# Patient Record
Sex: Female | Born: 1980 | Race: White | Hispanic: No | Marital: Married | Smoking: Never smoker
Health system: Southern US, Community
[De-identification: ages and names within clinical notes are randomized; demographics above are authoritative.]

## PROBLEM LIST (undated history)

## (undated) DIAGNOSIS — G510 Bell's palsy: Secondary | ICD-10-CM

## (undated) DIAGNOSIS — N200 Calculus of kidney: Secondary | ICD-10-CM

## (undated) DIAGNOSIS — Z789 Other specified health status: Secondary | ICD-10-CM

## (undated) HISTORY — PX: NO PAST SURGERIES: SHX2092

## (undated) HISTORY — PX: CATARACT EXTRACTION: SUR2

## (undated) HISTORY — PX: DILATION AND CURETTAGE OF UTERUS: SHX78

## (undated) HISTORY — PX: EYE SURGERY: SHX253

## (undated) HISTORY — PX: ANTERIOR CRUCIATE LIGAMENT REPAIR: SHX115

---
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
## 1999-08-02 ENCOUNTER — Other Ambulatory Visit: Admission: RE | Admit: 1999-08-02 | Discharge: 1999-08-02 | Payer: Self-pay | Admitting: Nurse Practitioner

## 2001-09-08 ENCOUNTER — Other Ambulatory Visit: Admission: RE | Admit: 2001-09-08 | Discharge: 2001-09-08 | Payer: Self-pay | Admitting: Gynecology

## 2002-10-20 ENCOUNTER — Other Ambulatory Visit: Admission: RE | Admit: 2002-10-20 | Discharge: 2002-10-20 | Payer: Self-pay | Admitting: Gynecology

## 2004-01-05 ENCOUNTER — Other Ambulatory Visit: Admission: RE | Admit: 2004-01-05 | Discharge: 2004-01-05 | Payer: Self-pay | Admitting: Gynecology

## 2004-04-25 ENCOUNTER — Encounter: Admission: RE | Admit: 2004-04-25 | Discharge: 2004-07-24 | Payer: Self-pay | Admitting: *Deleted

## 2004-07-25 ENCOUNTER — Encounter: Admission: RE | Admit: 2004-07-25 | Discharge: 2004-09-02 | Payer: Self-pay | Admitting: *Deleted

## 2005-03-03 ENCOUNTER — Ambulatory Visit: Payer: Self-pay | Admitting: Internal Medicine

## 2005-07-10 ENCOUNTER — Other Ambulatory Visit: Admission: RE | Admit: 2005-07-10 | Discharge: 2005-07-10 | Payer: Self-pay | Admitting: Gynecology

## 2006-05-05 ENCOUNTER — Ambulatory Visit: Payer: Self-pay | Admitting: Internal Medicine

## 2007-06-07 ENCOUNTER — Other Ambulatory Visit: Admission: RE | Admit: 2007-06-07 | Discharge: 2007-06-07 | Payer: Self-pay | Admitting: Gynecology

## 2012-12-22 NOTE — L&D Delivery Note (Signed)
Delivery Note At 4:22 PM a viable and healthy female was delivered via Vaginal, Spontaneous Delivery (Presentation: Right Occiput Anterior).  APGAR: 9, 9; weight pending.   Placenta status: Intact, Spontaneous.  Cord: 3 vessels with the following complications: None.  Cord pH: na Tight [place] x one reduced on perineum.  Anesthesia: Epidural  Episiotomy: none Lacerations: second Suture Repair: 2.0 vicryl rapide Est. Blood Loss (mL): 200  Mom to postpartum.  Baby to nursery-stable.  Denetra Formoso J 10/06/2013, 4:40 PM

## 2013-02-28 LAB — OB RESULTS CONSOLE HIV ANTIBODY (ROUTINE TESTING)
HIV: NONREACTIVE
HIV: NONREACTIVE

## 2013-02-28 LAB — OB RESULTS CONSOLE RPR
RPR: NONREACTIVE
RPR: NONREACTIVE

## 2013-02-28 LAB — OB RESULTS CONSOLE ABO/RH: RH Type: NEGATIVE

## 2013-02-28 LAB — OB RESULTS CONSOLE RUBELLA ANTIBODY, IGM: Rubella: IMMUNE

## 2013-02-28 LAB — OB RESULTS CONSOLE HEPATITIS B SURFACE ANTIGEN: Hepatitis B Surface Ag: NEGATIVE

## 2013-02-28 LAB — OB RESULTS CONSOLE ANTIBODY SCREEN: Antibody Screen: NEGATIVE

## 2013-03-02 LAB — OB RESULTS CONSOLE GC/CHLAMYDIA
Chlamydia: NEGATIVE
Gonorrhea: NEGATIVE
Gonorrhea: NEGATIVE

## 2013-09-01 LAB — OB RESULTS CONSOLE GBS
GBS: NEGATIVE
GBS: NEGATIVE

## 2013-09-02 LAB — OB RESULTS CONSOLE GBS: GBS: NEGATIVE

## 2013-10-05 ENCOUNTER — Other Ambulatory Visit: Payer: Self-pay | Admitting: Obstetrics and Gynecology

## 2013-10-06 ENCOUNTER — Encounter (HOSPITAL_COMMUNITY): Payer: 59 | Admitting: Anesthesiology

## 2013-10-06 ENCOUNTER — Inpatient Hospital Stay (HOSPITAL_COMMUNITY): Payer: 59 | Admitting: Anesthesiology

## 2013-10-06 ENCOUNTER — Encounter (HOSPITAL_COMMUNITY): Payer: Self-pay | Admitting: *Deleted

## 2013-10-06 ENCOUNTER — Inpatient Hospital Stay (HOSPITAL_COMMUNITY)
Admission: AD | Admit: 2013-10-06 | Discharge: 2013-10-07 | DRG: 775 | Disposition: A | Discharge status: Home / Self Care | Payer: 59 | Source: Ambulatory Visit | Attending: Obstetrics and Gynecology | Admitting: Obstetrics and Gynecology

## 2013-10-06 DIAGNOSIS — O480 Post-term pregnancy: Principal | ICD-10-CM | POA: Diagnosis present

## 2013-10-06 HISTORY — DX: Other specified health status: Z78.9

## 2013-10-06 HISTORY — DX: Bell's palsy: G51.0

## 2013-10-06 LAB — CBC
Hemoglobin: 12.7 g/dL (ref 12.0–15.0)
MCH: 30 pg (ref 26.0–34.0)
MCHC: 34.7 g/dL (ref 30.0–36.0)
MCV: 86.5 fL (ref 78.0–100.0)
Platelets: 169 10*3/uL (ref 150–400)
RDW: 14.5 % (ref 11.5–15.5)
WBC: 8.4 10*3/uL (ref 4.0–10.5)

## 2013-10-06 LAB — ABO/RH: ABO/RH(D): A NEG

## 2013-10-06 MED ORDER — FLEET ENEMA 7-19 GM/118ML RE ENEM: 1.0000 | ENEMA | RECTAL | Status: DC | PRN

## 2013-10-06 MED ORDER — DIPHENHYDRAMINE HCL 25 MG PO CAPS: 25.0000 mg | ORAL_CAPSULE | Freq: Four times a day (QID) | ORAL | Status: DC | PRN

## 2013-10-06 MED ORDER — TERBUTALINE SULFATE 1 MG/ML IJ SOLN: 0.2500 mg | Freq: Once | INTRAMUSCULAR | Status: DC | PRN

## 2013-10-06 MED ORDER — LACTATED RINGERS IV SOLN: 500.0000 mL | INTRAVENOUS | Status: DC | PRN

## 2013-10-06 MED ORDER — LIDOCAINE HCL (PF) 1 % IJ SOLN
30.0000 mL | INTRAMUSCULAR | Status: DC | PRN
  Filled 2013-10-06 (×2): qty 30

## 2013-10-06 MED ORDER — FENTANYL 2.5 MCG/ML BUPIVACAINE 1/10 % EPIDURAL INFUSION (WH - ANES)
14.0000 mL/h | INTRAMUSCULAR | Status: DC | PRN
  Administered 2013-10-06: 14 mL/h via EPIDURAL
  Filled 2013-10-06: qty 125

## 2013-10-06 MED ORDER — OXYTOCIN 40 UNITS IN LACTATED RINGERS INFUSION - SIMPLE MED
1.0000 m[IU]/min | INTRAVENOUS | Status: DC
  Administered 2013-10-06: 6 m[IU]/min via INTRAVENOUS
  Administered 2013-10-06: 4 m[IU]/min via INTRAVENOUS
  Administered 2013-10-06: 8 m[IU]/min via INTRAVENOUS
  Filled 2013-10-06: qty 1000

## 2013-10-06 MED ORDER — IBUPROFEN 600 MG PO TABS
600.0000 mg | ORAL_TABLET | Freq: Four times a day (QID) | ORAL | Status: DC
  Administered 2013-10-06 – 2013-10-07 (×5): 600 mg via ORAL
  Filled 2013-10-06 (×4): qty 1

## 2013-10-06 MED ORDER — EPHEDRINE 5 MG/ML INJ
10.0000 mg | INTRAVENOUS | Status: DC | PRN
  Filled 2013-10-06: qty 2

## 2013-10-06 MED ORDER — PHENYLEPHRINE 40 MCG/ML (10ML) SYRINGE FOR IV PUSH (FOR BLOOD PRESSURE SUPPORT)
80.0000 ug | PREFILLED_SYRINGE | INTRAVENOUS | Status: DC | PRN
  Filled 2013-10-06: qty 2
  Filled 2013-10-06: qty 5

## 2013-10-06 MED ORDER — ACETAMINOPHEN 325 MG PO TABS: 650.0000 mg | ORAL_TABLET | ORAL | Status: DC | PRN

## 2013-10-06 MED ORDER — SODIUM BICARBONATE 8.4 % IV SOLN
INTRAVENOUS | Status: DC | PRN
  Administered 2013-10-06: 5 mL via EPIDURAL

## 2013-10-06 MED ORDER — TETANUS-DIPHTH-ACELL PERTUSSIS 5-2.5-18.5 LF-MCG/0.5 IM SUSP: 0.5000 mL | Freq: Once | INTRAMUSCULAR | Status: DC

## 2013-10-06 MED ORDER — OXYTOCIN BOLUS FROM INFUSION: 500.0000 mL | INTRAVENOUS | Status: DC

## 2013-10-06 MED ORDER — LACTATED RINGERS IV SOLN
INTRAVENOUS | Status: DC
  Administered 2013-10-06: 950 mL via INTRAVENOUS

## 2013-10-06 MED ORDER — LANOLIN HYDROUS EX OINT: TOPICAL_OINTMENT | CUTANEOUS | Status: DC | PRN

## 2013-10-06 MED ORDER — DIPHENHYDRAMINE HCL 50 MG/ML IJ SOLN: 12.5000 mg | INTRAMUSCULAR | Status: DC | PRN

## 2013-10-06 MED ORDER — EPHEDRINE 5 MG/ML INJ
10.0000 mg | INTRAVENOUS | Status: DC | PRN
  Filled 2013-10-06: qty 2
  Filled 2013-10-06: qty 4

## 2013-10-06 MED ORDER — METHYLERGONOVINE MALEATE 0.2 MG/ML IJ SOLN: 0.2000 mg | INTRAMUSCULAR | Status: DC | PRN

## 2013-10-06 MED ORDER — ONDANSETRON HCL 4 MG/2ML IJ SOLN: 4.0000 mg | Freq: Four times a day (QID) | INTRAMUSCULAR | Status: DC | PRN

## 2013-10-06 MED ORDER — IBUPROFEN 600 MG PO TABS
600.0000 mg | ORAL_TABLET | Freq: Four times a day (QID) | ORAL | Status: DC | PRN
  Filled 2013-10-06: qty 1

## 2013-10-06 MED ORDER — ZOLPIDEM TARTRATE 5 MG PO TABS: 5.0000 mg | ORAL_TABLET | Freq: Every evening | ORAL | Status: DC | PRN

## 2013-10-06 MED ORDER — SENNOSIDES-DOCUSATE SODIUM 8.6-50 MG PO TABS
2.0000 | ORAL_TABLET | ORAL | Status: DC
  Administered 2013-10-07: 2 via ORAL
  Filled 2013-10-06: qty 2

## 2013-10-06 MED ORDER — PHENYLEPHRINE 40 MCG/ML (10ML) SYRINGE FOR IV PUSH (FOR BLOOD PRESSURE SUPPORT)
80.0000 ug | PREFILLED_SYRINGE | INTRAVENOUS | Status: DC | PRN
  Filled 2013-10-06: qty 2

## 2013-10-06 MED ORDER — OXYTOCIN 40 UNITS IN LACTATED RINGERS INFUSION - SIMPLE MED
62.5000 mL/h | INTRAVENOUS | Status: DC
  Administered 2013-10-06: 2 mL/h via INTRAVENOUS

## 2013-10-06 MED ORDER — BENZOCAINE-MENTHOL 20-0.5 % EX AERO
1.0000 "application " | INHALATION_SPRAY | CUTANEOUS | Status: DC | PRN
  Administered 2013-10-06: 1 via TOPICAL
  Filled 2013-10-06: qty 56

## 2013-10-06 MED ORDER — LIDOCAINE HCL (PF) 1 % IJ SOLN: 30.0000 mL | INTRAMUSCULAR | Status: DC | PRN

## 2013-10-06 MED ORDER — METHYLERGONOVINE MALEATE 0.2 MG PO TABS: 0.2000 mg | ORAL_TABLET | ORAL | Status: DC | PRN

## 2013-10-06 MED ORDER — ONDANSETRON HCL 4 MG PO TABS: 4.0000 mg | ORAL_TABLET | ORAL | Status: DC | PRN

## 2013-10-06 MED ORDER — IBUPROFEN 600 MG PO TABS: 600.0000 mg | ORAL_TABLET | Freq: Four times a day (QID) | ORAL | Status: DC | PRN

## 2013-10-06 MED ORDER — SIMETHICONE 80 MG PO CHEW: 80.0000 mg | CHEWABLE_TABLET | ORAL | Status: DC | PRN

## 2013-10-06 MED ORDER — OXYCODONE-ACETAMINOPHEN 5-325 MG PO TABS: 1.0000 | ORAL_TABLET | ORAL | Status: DC | PRN

## 2013-10-06 MED ORDER — CITRIC ACID-SODIUM CITRATE 334-500 MG/5ML PO SOLN: 30.0000 mL | ORAL | Status: DC | PRN

## 2013-10-06 MED ORDER — ONDANSETRON HCL 4 MG/2ML IJ SOLN: 4.0000 mg | INTRAMUSCULAR | Status: DC | PRN

## 2013-10-06 MED ORDER — OXYTOCIN 40 UNITS IN LACTATED RINGERS INFUSION - SIMPLE MED
62.5000 mL/h | INTRAVENOUS | Status: DC
  Administered 2013-10-06: 62.5 mL/h via INTRAVENOUS

## 2013-10-06 MED ORDER — DIBUCAINE 1 % RE OINT: 1.0000 "application " | TOPICAL_OINTMENT | RECTAL | Status: DC | PRN

## 2013-10-06 MED ORDER — LACTATED RINGERS IV SOLN
500.0000 mL | Freq: Once | INTRAVENOUS | Status: AC
  Administered 2013-10-06: 500 mL via INTRAVENOUS

## 2013-10-06 MED ORDER — BUTORPHANOL TARTRATE 1 MG/ML IJ SOLN: 1.0000 mg | INTRAMUSCULAR | Status: DC | PRN

## 2013-10-06 MED ORDER — PRENATAL MULTIVITAMIN CH
1.0000 | ORAL_TABLET | Freq: Every day | ORAL | Status: DC
  Administered 2013-10-07: 1 via ORAL
  Filled 2013-10-06: qty 1

## 2013-10-06 MED ORDER — WITCH HAZEL-GLYCERIN EX PADS: 1.0000 "application " | MEDICATED_PAD | CUTANEOUS | Status: DC | PRN

## 2013-10-06 MED ORDER — LACTATED RINGERS IV SOLN: INTRAVENOUS | Status: DC

## 2013-10-06 NOTE — H&P (Signed)
Leslie Schultz is a 32 y.o. female presenting for postdates induction. Maternal Medical History:  Fetal activity: Perceived fetal activity is normal.    Prenatal complications: no prenatal complications Prenatal Complications - Diabetes: none.    OB History   No data available     No past medical history on file. No past surgical history on file. Family History: family history is not on file. Social History:  has no tobacco, alcohol, and drug history on file.   Prenatal Transfer Tool  Maternal Diabetes: No Genetic Screening: Normal Maternal Ultrasounds/Referrals: Normal Fetal Ultrasounds or other Referrals:  None Maternal Substance Abuse:  No Significant Maternal Medications:  None Significant Maternal Lab Results:  None Other Comments:  None  Review of Systems  Constitutional: Negative.   HENT: Negative.   Respiratory: Negative.   Cardiovascular: Negative.   Gastrointestinal: Negative.   Genitourinary: Negative.   Musculoskeletal: Negative.   Skin: Negative.   Neurological: Negative.   Endo/Heme/Allergies: Negative.   Psychiatric/Behavioral: Negative.       There were no vitals taken for this visit. Maternal Exam:  Uterine Assessment: Contraction strength is mild.  Abdomen: Patient reports no abdominal tenderness. Fetal presentation: vertex  Introitus: Normal vulva. Normal vagina.  Ferning test: not done.  Nitrazine test: not done.  Pelvis: adequate for delivery.   Cervix: Cervix evaluated by digital exam.     Physical Exam  Constitutional: She is oriented to person, place, and time. She appears well-developed and well-nourished.  Eyes: Pupils are equal, round, and reactive to light.  Neck: Normal range of motion.  Cardiovascular: Normal rate and regular rhythm.   Respiratory: Effort normal.  GI: Soft.  Genitourinary: Vagina normal and uterus normal.  Musculoskeletal: Normal range of motion.  Neurological: She is alert and oriented to person, place, and  time.  Skin: Skin is warm.    Prenatal labs: ABO, Rh: A/Negative/-- (03/10 0000) Antibody: Negative (03/10 0000) Rubella: Immune (03/10 0000) RPR: Nonreactive, Nonreactive (03/10 0000)  HBsAg: Negative (03/10 0000)  HIV: Non-reactive, Non-reactive (03/10 0000)  GBS: Negative (09/12 0000)   Assessment/Plan: Postdates induction Pitocin   Leslie Schultz J 10/06/2013, 7:01 AM

## 2013-10-06 NOTE — Anesthesia Preprocedure Evaluation (Signed)

## 2013-10-06 NOTE — Progress Notes (Signed)
Leslie Schultz is a 32 y.o. G2P1 at [redacted]w[redacted]d by LMP admitted for induction of labor due to Post dates. Due date 10/10.  Subjective: Requesting epidural  Objective: BP 120/72  Pulse 100  Temp(Src) 98.2 F (36.8 C) (Oral)  Resp 20  Ht 5\' 7"  (1.702 m)  Wt 94.802 kg (209 lb)  BMI 32.73 kg/m2  SpO2 100%  LMP 12/25/2012      FHT:  FHR: 125 bpm, variability: moderate,  accelerations:  Present,  decelerations:  Absent UC:   regular, every 3 minutes SVE:   Dilation: 3.5 Effacement (%): 70 Station: -1 Exam by:: Ace Gins, RN  Labs: Lab Results  Component Value Date   WBC 8.4 10/06/2013   HGB 12.7 10/06/2013   HCT 36.6 10/06/2013   MCV 86.5 10/06/2013   PLT 169 10/06/2013    Assessment / Plan: Induction of labor due to postterm,  progressing well on pitocin  Labor: Progressing normally Preeclampsia:  no signs or symptoms of toxicity and labs stable Fetal Wellbeing:  Category I Pain Control:  Epidural I/D:  n/a Anticipated MOD:  NSVD  Leslie Schultz J 10/06/2013, 3:09 PM

## 2013-10-06 NOTE — Anesthesia Procedure Notes (Signed)

## 2013-10-06 NOTE — Progress Notes (Signed)
Leslie Schultz is a 32 y.o. No obstetric history on file. at Unknown by LMP admitted for induction of labor due to Post dates. Due date 10/10.  Subjective: comfortable  Objective: There were no vitals taken for this visit.      FHT:  FHR: 145 bpm, variability: moderate,  accelerations:  Present,  decelerations:  Absent UC:   irregular, every 10 minutes SVE:    2/60/-1 AROM- clear  Labs: No results found for this basename: WBC, HGB, HCT, MCV, PLT    Assessment / Plan: Induction of labor due to postterm,  progressing well on pitocin  Labor: Progressing on Pitocin, will continue to increase then AROM Preeclampsia:  labs stable Fetal Wellbeing:  Category I Pain Control:  Labor support without medications I/D:  n/a Anticipated MOD:  NSVD  Leslie Schultz 10/06/2013, 6:59 AM

## 2013-10-07 LAB — CBC
HCT: 38.1 % (ref 36.0–46.0)
MCH: 29.7 pg (ref 26.0–34.0)
MCV: 87.8 fL (ref 78.0–100.0)
Platelets: 142 10*3/uL — ABNORMAL LOW (ref 150–400)
RBC: 4.34 MIL/uL (ref 3.87–5.11)
WBC: 10.4 10*3/uL (ref 4.0–10.5)

## 2013-10-07 MED ORDER — IBUPROFEN 600 MG PO TABS: 600.0000 mg | ORAL_TABLET | Freq: Four times a day (QID) | ORAL | Status: DC

## 2013-10-07 MED ORDER — RHO D IMMUNE GLOBULIN 1500 UNIT/2ML IJ SOLN
300.0000 ug | Freq: Once | INTRAMUSCULAR | Status: AC
  Administered 2013-10-07: 300 ug via INTRAMUSCULAR
  Filled 2013-10-07: qty 2

## 2013-10-07 NOTE — Anesthesia Postprocedure Evaluation (Signed)
Anesthesia Post Note  Patient: Leslie Schultz  Procedure(s) Performed: * No procedures listed *  Anesthesia type: Epidural  Patient location: Mother/Baby  Post pain: Pain level controlled  Post assessment: Post-op Vital signs reviewed  Last Vitals:  Filed Vitals:   10/07/13 0640  BP: 120/61  Pulse: 76  Temp: 36.6 C  Resp: 18    Post vital signs: Reviewed  Level of consciousness:alert  Complications: No apparent anesthesia complications

## 2013-10-07 NOTE — Progress Notes (Signed)
Patient ID: Leslie Schultz, female   DOB: 03/19/1981, 32 y.o.   MRN: [id number] PPD # 1 SVD  S:  Reports feeling well and ready for an early discharge             Tolerating po/ No nausea or vomiting             Bleeding is light             Pain controlled with ibuprofen (OTC)             Up ad lib / ambulatory / voiding without difficulties    Newborn  Information for the patient's newborn:  Gwendlyn, [place] [[id number]]  female  breast feeding    O:  A & O x 3, in no apparent distress, very pleasant             VS:  Filed Vitals:   10/06/13 1731 10/06/13 1746 10/06/13 1845 10/07/13 0640  BP: 118/70 107/76 118/79 120/61  Pulse: 74 81 76 76  Temp:  98.1 F (36.7 C) 97.5 F (36.4 C) 97.9 F (36.6 C)  TempSrc:  Oral Axillary Oral  Resp: 18 18 20 18   Height:      Weight:      SpO2:        LABS:  Recent Labs  10/06/13 0700 10/07/13 0655  WBC 8.4 10.4  HGB 12.7 12.9  HCT 36.6 38.1  PLT 169 142*    Blood type: --/--/A NEG (10/17 7829)  Rubella: Immune (03/10 0000)     Lungs: Clear and unlabored  Heart: regular rate and rhythm / no murmurs  Abdomen: soft, non-tender, non-distended, normal bowel sounds             Fundus: firm, non-tender, U-2  Perineum: 2nd degree repair healing well, no edema  Lochia: light  Extremities: no edema, no calf pain or tenderness, no Homans    A/P: PPD # 1  32 y.o., [id number]   Active Problems:   Postpartum care following vaginal delivery (10/16)   Doing well - stable status  Routine post partum orders  Anticipate discharge tomorrow   Raelyn Mora, M, MSN, CNM 10/07/2013, 11:40 AM

## 2013-10-07 NOTE — Discharge Summary (Signed)
Obstetric Discharge Summary Reason for Admission: induction of labor Prenatal Procedures: ultrasound Intrapartum Procedures: spontaneous vaginal delivery Postpartum Procedures: none Complications-Operative and Postpartum: 2nd degree perineal laceration Hemoglobin  Date Value Range Status  10/07/2013 12.9  12.0 - 15.0 g/dL Final     HCT  Date Value Range Status  10/07/2013 38.1  36.0 - 46.0 % Final    Physical Exam:  General: alert, cooperative and no distress Lochia: appropriate Uterine Fundus: firm, midline, U-2 DVT Evaluation: No evidence of DVT seen on physical exam. Negative Homan's sign. No cords or calf tenderness. No significant calf/ankle edema.  Discharge Diagnoses: Term Pregnancy-delivered  Discharge Information: Date: 10/07/2013 Activity: pelvic rest Diet: routine Medications: PNV and Ibuprofen Condition: stable Instructions: refer to practice specific booklet Discharge to: home Follow-up Information   Follow up with Lenoard Aden, MD. Schedule an appointment as soon as possible for a visit in 6 weeks.   Specialty:  Obstetrics and Gynecology   Contact information:   [address] [place] [address] [phone]       Newborn Data: Live born female on 10/06/2013 Birth Weight: 8 lb 10.3 oz (3920 g) APGAR: 9, 9  Home with mother.  Kenard Gower, MSN, CNM 10/07/2013, 11:49 AM

## 2013-10-08 LAB — RH IG WORKUP (INCLUDES ABO/RH)
ABO/RH(D): A NEG
Antibody Screen: NEGATIVE
Fetal Screen: NEGATIVE

## 2014-10-23 ENCOUNTER — Encounter (HOSPITAL_COMMUNITY): Payer: Self-pay | Admitting: *Deleted

## 2015-06-08 ENCOUNTER — Emergency Department (HOSPITAL_BASED_OUTPATIENT_CLINIC_OR_DEPARTMENT_OTHER): Payer: 59

## 2015-06-08 ENCOUNTER — Emergency Department (HOSPITAL_BASED_OUTPATIENT_CLINIC_OR_DEPARTMENT_OTHER)
Admission: EM | Admit: 2015-06-08 | Discharge: 2015-06-08 | Disposition: A | Discharge status: Home / Self Care | Payer: 59 | Attending: Emergency Medicine | Admitting: Emergency Medicine

## 2015-06-08 ENCOUNTER — Encounter (HOSPITAL_BASED_OUTPATIENT_CLINIC_OR_DEPARTMENT_OTHER): Payer: Self-pay | Admitting: *Deleted

## 2015-06-08 DIAGNOSIS — Z3202 Encounter for pregnancy test, result negative: Secondary | ICD-10-CM | POA: Insufficient documentation

## 2015-06-08 DIAGNOSIS — R109 Unspecified abdominal pain: Secondary | ICD-10-CM

## 2015-06-08 DIAGNOSIS — N201 Calculus of ureter: Secondary | ICD-10-CM | POA: Diagnosis not present

## 2015-06-08 DIAGNOSIS — Z79899 Other long term (current) drug therapy: Secondary | ICD-10-CM | POA: Diagnosis not present

## 2015-06-08 DIAGNOSIS — Z8669 Personal history of other diseases of the nervous system and sense organs: Secondary | ICD-10-CM | POA: Insufficient documentation

## 2015-06-08 LAB — URINALYSIS, ROUTINE W REFLEX MICROSCOPIC
Bilirubin Urine: NEGATIVE
Glucose, UA: NEGATIVE mg/dL
KETONES UR: 15 mg/dL — AB
LEUKOCYTES UA: NEGATIVE
NITRITE: NEGATIVE
PH: 6 (ref 5.0–8.0)
Protein, ur: NEGATIVE mg/dL
Specific Gravity, Urine: 1.027 (ref 1.005–1.030)
Urobilinogen, UA: 0.2 mg/dL (ref 0.0–1.0)

## 2015-06-08 LAB — COMPREHENSIVE METABOLIC PANEL
ALBUMIN: 4.8 g/dL (ref 3.5–5.0)
ALK PHOS: 49 U/L (ref 38–126)
ALT: 18 U/L (ref 14–54)
ANION GAP: 11 (ref 5–15)
AST: 21 U/L (ref 15–41)
BILIRUBIN TOTAL: 0.6 mg/dL (ref 0.3–1.2)
BUN: 14 mg/dL (ref 6–20)
CHLORIDE: 102 mmol/L (ref 101–111)
CO2: 25 mmol/L (ref 22–32)
Calcium: 9.2 mg/dL (ref 8.9–10.3)
Creatinine, Ser: 0.78 mg/dL (ref 0.44–1.00)
GFR calc Af Amer: >60 mL/min (ref >60)
GFR calc non Af Amer: >60 mL/min (ref >60)
Glucose, Bld: 130 mg/dL — ABNORMAL HIGH (ref 65–99)
Potassium: 3.4 mmol/L — ABNORMAL LOW (ref 3.5–5.1)
SODIUM: 138 mmol/L (ref 135–145)
Total Protein: 7.7 g/dL (ref 6.5–8.1)

## 2015-06-08 LAB — CBC WITH DIFFERENTIAL/PLATELET
BASOS PCT: 0 % (ref 0–1)
Basophils Absolute: 0 10*3/uL (ref 0.0–0.1)
Eosinophils Absolute: 0 10*3/uL (ref 0.0–0.7)
Eosinophils Relative: 1 % (ref 0–5)
HCT: 42.8 % (ref 36.0–46.0)
Hemoglobin: 14.4 g/dL (ref 12.0–15.0)
Lymphocytes Relative: 23 % (ref 12–46)
Lymphs Abs: 1.6 10*3/uL (ref 0.7–4.0)
MCH: 30.4 pg (ref 26.0–34.0)
MCHC: 33.6 g/dL (ref 30.0–36.0)
MCV: 90.3 fL (ref 78.0–100.0)
Monocytes Absolute: 0.5 10*3/uL (ref 0.1–1.0)
Monocytes Relative: 7 % (ref 3–12)
NEUTROS ABS: 4.8 10*3/uL (ref 1.7–7.7)
NEUTROS PCT: 69 % (ref 43–77)
Platelets: 219 10*3/uL (ref 150–400)
RBC: 4.74 MIL/uL (ref 3.87–5.11)
RDW: 12.7 % (ref 11.5–15.5)
WBC: 6.9 10*3/uL (ref 4.0–10.5)

## 2015-06-08 LAB — URINE MICROSCOPIC-ADD ON

## 2015-06-08 LAB — PREGNANCY, URINE: Preg Test, Ur: NEGATIVE

## 2015-06-08 MED ORDER — OXYCODONE-ACETAMINOPHEN 5-325 MG PO TABS
1.0000 | ORAL_TABLET | Freq: Once | ORAL | Status: AC
  Administered 2015-06-08: 1 via ORAL
  Filled 2015-06-08: qty 1

## 2015-06-08 MED ORDER — SODIUM CHLORIDE 0.9 % IV BOLUS (SEPSIS)
1000.0000 mL | Freq: Once | INTRAVENOUS | Status: AC
  Administered 2015-06-08: 1000 mL via INTRAVENOUS

## 2015-06-08 MED ORDER — FENTANYL CITRATE (PF) 100 MCG/2ML IJ SOLN
50.0000 ug | Freq: Once | INTRAMUSCULAR | Status: DC
  Filled 2015-06-08: qty 2

## 2015-06-08 MED ORDER — NAPROXEN 500 MG PO TABS: 500.0000 mg | ORAL_TABLET | Freq: Two times a day (BID) | ORAL | Status: DC

## 2015-06-08 MED ORDER — HYDROMORPHONE HCL 1 MG/ML IJ SOLN
1.0000 mg | Freq: Once | INTRAMUSCULAR | Status: AC
  Administered 2015-06-08: 1 mg via INTRAVENOUS
  Filled 2015-06-08: qty 1

## 2015-06-08 MED ORDER — ONDANSETRON HCL 4 MG/2ML IJ SOLN
4.0000 mg | Freq: Once | INTRAMUSCULAR | Status: AC
  Administered 2015-06-08: 4 mg via INTRAVENOUS
  Filled 2015-06-08: qty 2

## 2015-06-08 MED ORDER — TAMSULOSIN HCL 0.4 MG PO CAPS: 0.4000 mg | ORAL_CAPSULE | Freq: Every day | ORAL | Status: DC

## 2015-06-08 MED ORDER — OXYCODONE-ACETAMINOPHEN 5-325 MG PO TABS: 1.0000 | ORAL_TABLET | Freq: Four times a day (QID) | ORAL | Status: DC | PRN

## 2015-06-08 MED ORDER — KETOROLAC TROMETHAMINE 30 MG/ML IJ SOLN
30.0000 mg | Freq: Once | INTRAMUSCULAR | Status: AC
  Administered 2015-06-08: 30 mg via INTRAVENOUS
  Filled 2015-06-08: qty 1

## 2015-06-08 NOTE — ED Provider Notes (Signed)
CSN: [id number]     Arrival date & time 06/08/15  1326 History   First MD Initiated Contact with Patient 06/08/15 1341     Chief Complaint  Patient presents with  . Flank Pain     (Consider location/radiation/quality/duration/timing/severity/associated sxs/prior Treatment) HPI Comments: Patient presents with right flank pain, severe, for approximately 2 hours. Pain is sharp and nonradiating. It has been associated with nausea but no vomiting. No new hematuria or urgency. No vaginal bleeding or discharge. No fevers. No history of kidney stones. Patient states that she had urinary urgency starting approximately 1 week ago. Patient was seen at outside urgent care and told she had a little bit of blood in her urine. She was started on Keflex. Symptoms did not improve so patient went and saw her OB. She was switched to nitrofurantoin and had a urine culture which resulted as negative. Patient took ibuprofen prior to arrival today. She was told to come to the emergency department if symptoms worsened by her OB. Onset of symptoms acute. Course is constant. Nothing makes symptoms better or worse.  Patient is a 34 y.o. female presenting with flank pain. The history is provided by the patient.  Flank Pain Pertinent negatives include no abdominal pain, chest pain, coughing, fever, headaches, myalgias, nausea, rash, sore throat or vomiting.    Past Medical History  Diagnosis Date  . Medical history non-contributory   . Bell's palsy    Past Surgical History  Procedure Laterality Date  . No past surgeries    . Dilation and curettage of uterus    . Anterior cruciate ligament repair     No family history on file. History  Substance Use Topics  . Smoking status: Never Smoker   . Smokeless tobacco: Never Used  . Alcohol Use: No   OB History    Gravida Para Term Preterm AB TAB SAB Ectopic Multiple Living   Review of Systems  Constitutional: Negative for fever.  HENT: Negative  for rhinorrhea and sore throat.   Eyes: Negative for redness.  Respiratory: Negative for cough.   Cardiovascular: Negative for chest pain.  Gastrointestinal: Negative for nausea, vomiting, abdominal pain and diarrhea.  Genitourinary: Positive for urgency and flank pain. Negative for dysuria, vaginal bleeding and vaginal discharge.  Musculoskeletal: Negative for myalgias.  Skin: Negative for rash.  Neurological: Negative for headaches.      Allergies  Review of patient's allergies indicates no known allergies.  Home Medications   Prior to Admission medications   Medication Sig Start Date End Date Taking? Authorizing Provider  nitrofurantoin, macrocrystal-monohydrate, (MACROBID) 100 MG capsule Take 100 mg by mouth 2 (two) times daily.   Yes Historical Provider, MD  ibuprofen (ADVIL,MOTRIN) 600 MG tablet Take 1 tablet (600 mg total) by mouth every 6 (six) hours. 10/07/13   Raelyn Mora, CNM  Prenatal Vit-Fe Fumarate-FA (PRENATAL MULTIVITAMIN) TABS tablet Take 1 tablet by mouth daily at 12 noon.    Historical Provider, MD   BP 118/73 mmHg  Pulse 95  Temp(Src) 97.8 F (36.6 C) (Oral)  Resp 18  Ht  (1.702 m)  Wt 179 lb (81.194 kg)  BMI 28.03 kg/m2  SpO2 100%  LMP 05/31/2015   Physical Exam  Constitutional: She appears well-developed and well-nourished. She appears distressed.  HENT:  Head: Normocephalic and atraumatic.  Eyes: Conjunctivae are normal. Right eye exhibits no discharge. Left eye exhibits no discharge.  Neck: Normal  range of motion. Neck supple.  Cardiovascular: Normal rate, regular rhythm and normal heart sounds.   Pulmonary/Chest: Effort normal and breath sounds normal.  Abdominal: Soft. Bowel sounds are normal. There is no tenderness. There is CVA tenderness (right).  Neurological: She is alert.  Skin: Skin is warm and dry.  Psychiatric: She has a normal mood and affect.  Nursing note and vitals reviewed.   ED Course  Procedures (including critical  care time) Labs Review Labs Reviewed  URINALYSIS, ROUTINE W REFLEX MICROSCOPIC (NOT AT [facility]) - Abnormal; Notable for the following:    Color, Urine AMBER (*)    Hgb urine dipstick SMALL (*)    Ketones, ur 15 (*)    All other components within normal limits  COMPREHENSIVE METABOLIC PANEL - Abnormal; Notable for the following:    Potassium 3.4 (*)    Glucose, Bld 130 (*)    All other components within normal limits  PREGNANCY, URINE  CBC WITH DIFFERENTIAL/PLATELET  URINE MICROSCOPIC-ADD ON    Imaging Review Ct Renal Stone Study  06/08/2015   CLINICAL DATA:  Right flank pain, hematuria, on antibiotics, no hx kidney stones  EXAM: CT ABDOMEN AND PELVIS WITHOUT CONTRAST  TECHNIQUE: Multidetector CT imaging of the abdomen and pelvis was performed following the standard protocol without IV contrast.  COMPARISON:  None.  FINDINGS: Lower chest:  Lung bases are clear.  Normal heart size.  Hepatobiliary: Normal liver. Normal gallbladder. No intrahepatic or extrahepatic biliary ductal dilatation.  Pancreas: Normal.  Spleen: Normal.  Adrenals/Urinary Tract: Normal adrenal glands. Normal left kidney. Moderate right hydroureteronephrosis with a distal 5 mm ureteral calculus just proximal to the UVJ. Normal bladder.  Stomach/Bowel: No bowel dilatation. No bowel wall thickening. No pneumatosis, pneumoperitoneum or portal venous gas. No abdominal or pelvic free fluid. Fat containing umbilical hernia.  Vascular/Lymphatic: Normal caliber aorta. No abdominal or pelvic lymphadenopathy.  Reproductive: Normal uterus. No adnexal mass. T-type IUD within the uterus.  Other: No fluid collection or hematoma.  Musculoskeletal: No lytic or sclerotic osseous lesion. No acute osseous abnormality of the lumbar spine or pelvis.  IMPRESSION: 1. 5 mm distal right ureteral calculus just proximal to the UVJ resulting in moderate right hydroureteronephrosis.   Electronically Signed   By: Elige Ko   On: 06/08/2015 14:45     EKG  Interpretation None       1:59 PM Patient seen and examined. Work-up initiated. Medications ordered.   Vital signs reviewed and are as follows: BP 118/73 mmHg  Pulse 95  Temp(Src) 97.8 F (36.6 C) (Oral)  Resp 18  Ht  (1.702 m)  Wt 179 lb (81.194 kg)  BMI 28.03 kg/m2  SpO2 100%  LMP 05/31/2015  2:50 PM Pt updated on CT findings. Her pain is much better after dilaudid. Will give toradol. Plan on discharge if pain remains controlled.   4:31 PM pain remains controlled. Patient counseled on kidney stone treatment. Urged patient to strain urine and save any stones. Urged urology follow-up and return to [facility] with any complications. Counseled patient to maintain good fluid intake.   Counseled patient on use of Flomax.   Patient counseled on use of narcotic pain medications. Counseled not to combine these medications with others containing tylenol. Urged not to drink alcohol, drive, or perform any other activities that requires focus while taking these medications. The patient verbalizes understanding and agrees with the plan.    MDM   Final diagnoses:  Flank pain  Right ureteral stone  Patient with right ureteral stone diagnosed on noncontrast CT. Normal renal function. No evidence of urinary tract infection. Patient is stable for discharge to home with symptomatic care and outpatient follow-up.  No dangerous or life-threatening conditions suspected or identified by history, physical exam, and by work-up. No indications for hospitalization identified.     Renne Crigler, PA-C 06/08/15 1631  Gwyneth Sprout, MD 06/10/15 1501

## 2015-06-08 NOTE — ED Notes (Signed)
Right flank pain for 2 hours. Hematuria and urgency a week ago and she was started on antibiotics but her culture was negative. Pale and nauseated.

## 2015-06-08 NOTE — Discharge Instructions (Signed)
Please read and follow all provided instructions.  Your diagnoses today include:  1. Right ureteral stone   2. Flank pain     Tests performed today include:  Urine test that showed blood in your urine and no infection  CT scan which showed a 5 millimeter kidney on the right side  Blood test that showed normal kidney function  Vital signs. See below for your results today.   Medications prescribed:   Percocet (oxycodone/acetaminophen) - narcotic pain medication  DO NOT drive or perform any activities that require you to be awake and alert because this medicine can make you drowsy. BE VERY CAREFUL not to take multiple medicines containing Tylenol (also called acetaminophen). Doing so can lead to an overdose which can damage your liver and cause liver failure and possibly death.   Flomax (tamsulosin) - relaxes smooth muscle to help kidney stones pass   Naproxen - anti-inflammatory pain medication  Do not exceed 500mg  naproxen every 12 hours, take with food  You have been prescribed an anti-inflammatory medication or NSAID. Take with food. Take smallest effective dose for the shortest duration needed for your pain. Stop taking if you experience stomach pain or vomiting.   Take any prescribed medications only as directed.  Home care instructions:  Follow any educational materials contained in this packet.  Please double your fluid intake for the next several days. Strain your urine and save any stones that may pass.   BE VERY CAREFUL not to take multiple medicines containing Tylenol (also called acetaminophen). Doing so can lead to an overdose which can damage your liver and cause liver failure and possibly death.   Follow-up instructions: Please follow-up with your urologist or the urologist referral (provided on front page) in the next 1 week for further evaluation of your symptoms.  If you need to return to the Emergency Department, go to [facility] and not [facility]. The urologists are located at [facility] and can better care for you at this location.  Return instructions:  If you need to return to the Emergency Department, go to [facility] and not [facility]. The urologists are located at [facility] and can better care for you at this location.   Please return to the Emergency Department if you experience worsening symptoms.  Please return if you develop fever or uncontrolled pain or vomiting.  Please return if you have any other emergent concerns.  Additional Information:  Your vital signs today were: BP 104/58 mmHg   Pulse 75   Temp(Src) 97.8 F (36.6 C) (Oral)   Resp 17   Ht 5\' 7"  (1.702 m)   Wt 179 lb (81.194 kg)   BMI 28.03 kg/m2   SpO2 100%   LMP 05/31/2015 If your blood pressure (BP) was elevated above 135/85 this visit, please have this repeated by your doctor within one month. --------------

## 2016-05-29 IMAGING — CT CT RENAL STONE PROTOCOL
2 of 4 series · 17 of 46 positions shown, 19 images · non-contrast
Comparison: None.

CLINICAL DATA: Right flank pain, hematuria, on antibiotics, no hx
kidney stones

EXAM:
CT ABDOMEN AND PELVIS WITHOUT CONTRAST
TECHNIQUE: Multidetector CT imaging of the abdomen and pelvis was performed
following the standard protocol without IV contrast.

[Series 2: renal stone < 200 lbs 5.0 b31f · axial · 0.75mm/px · z∈[-580,-140]mm · 14 of 96 slices shown, 16 images]
[im 4/96  soft-tissue]
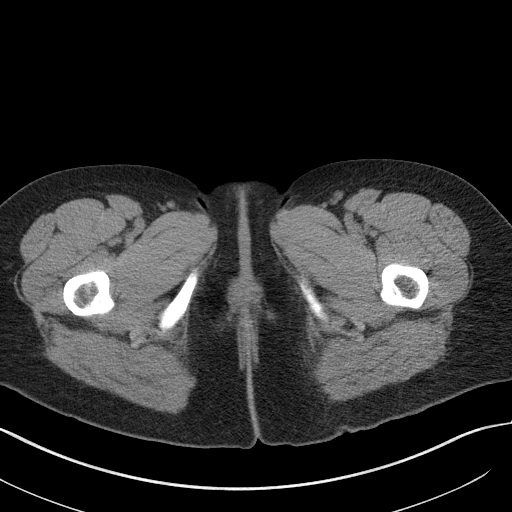
[im 4/96  bone]
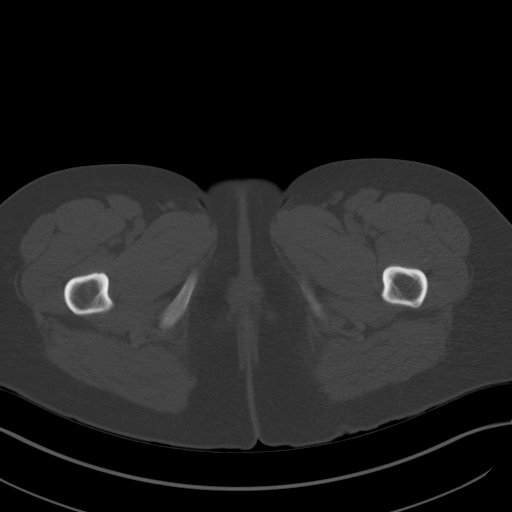
[im 12/96  soft-tissue]
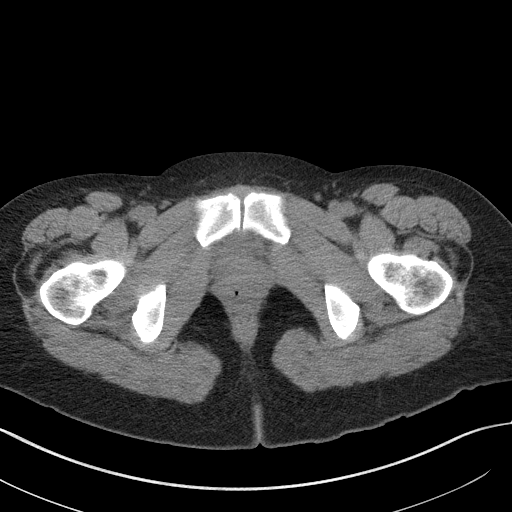
[im 20/96  soft-tissue]
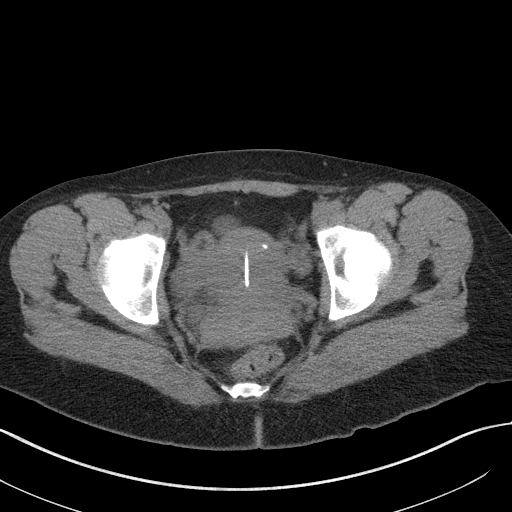
[im 27/96  soft-tissue]
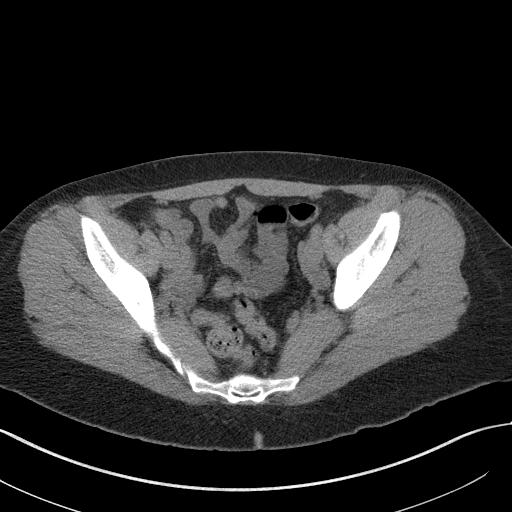
[im 31/96  soft-tissue]
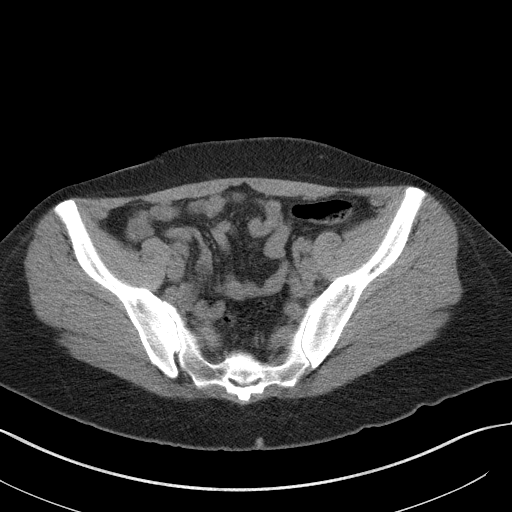
[im 39/96  soft-tissue]
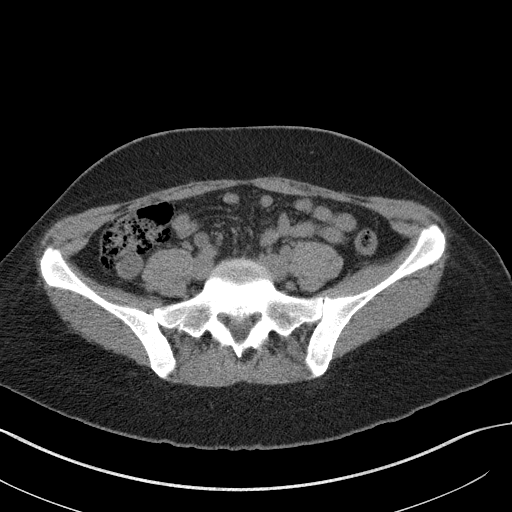
[im 46/96  soft-tissue]
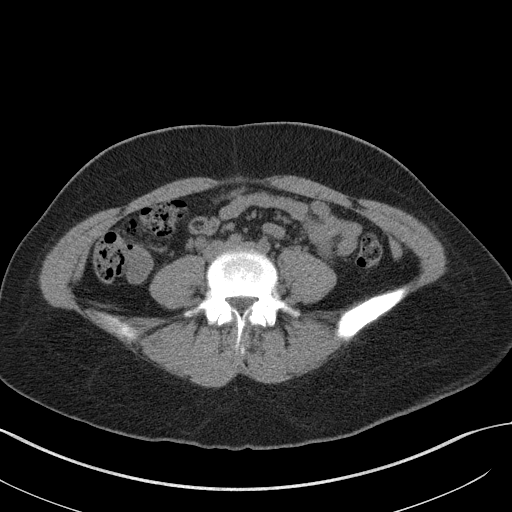
[im 50/96  soft-tissue]
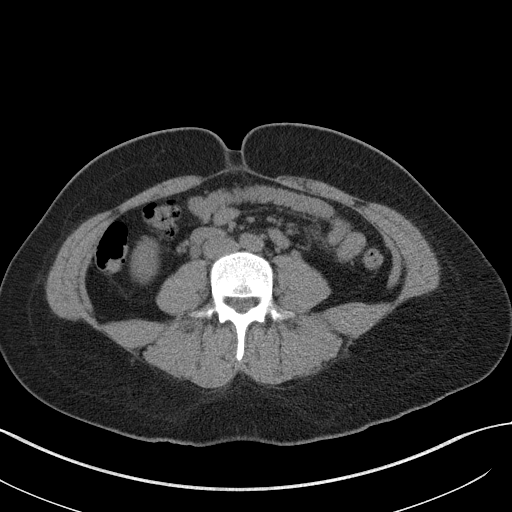
[im 58/96  soft-tissue]
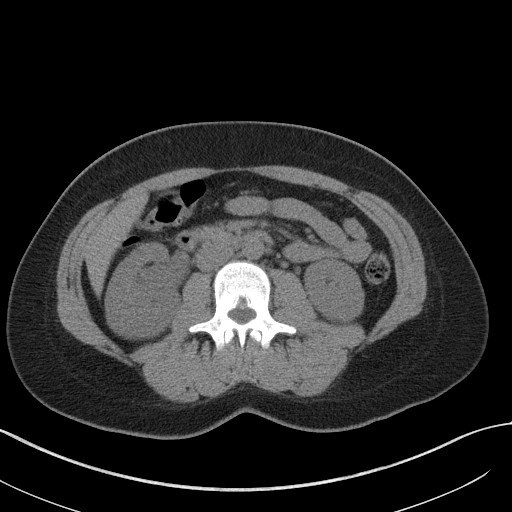
[im 58/96  bone]
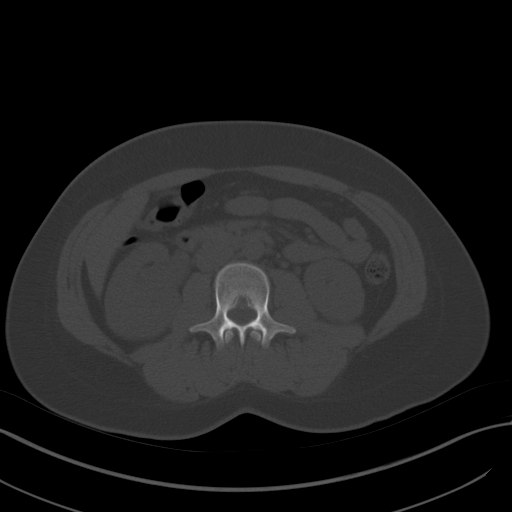
[im 65/96  soft-tissue]
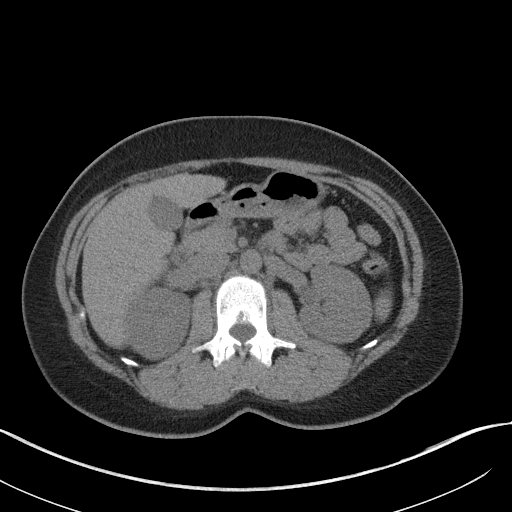
[im 73/96  soft-tissue]
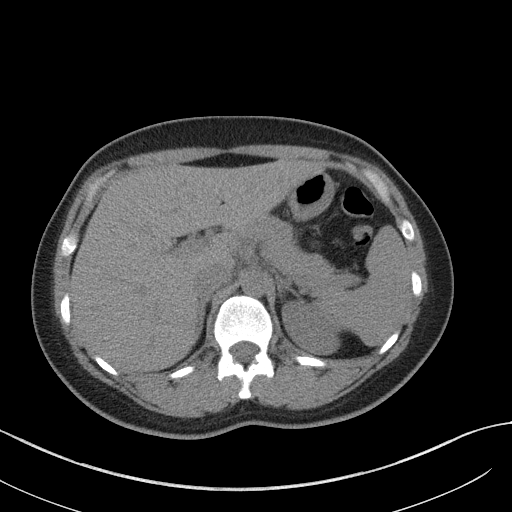
[im 77/96  soft-tissue]
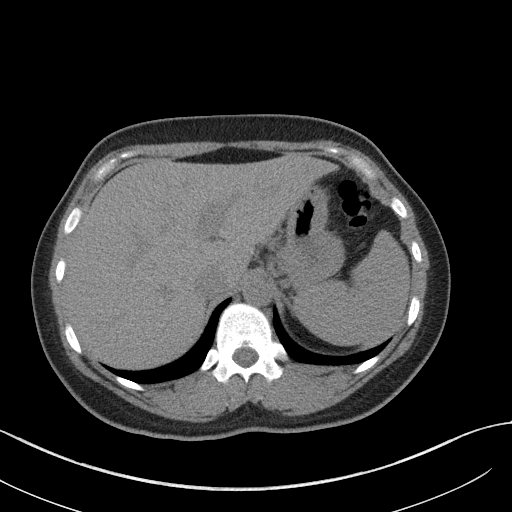
[im 84/96  soft-tissue]
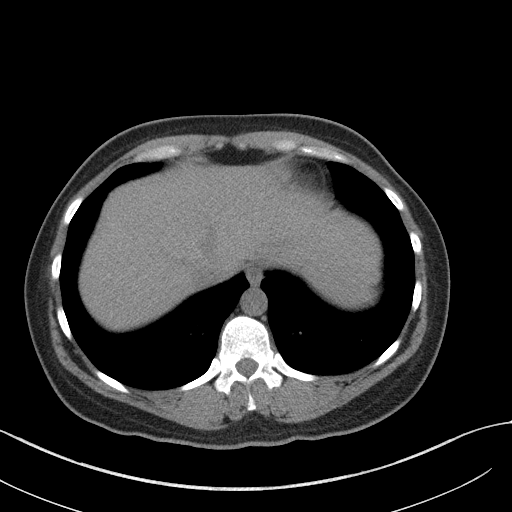
[im 92/96  soft-tissue]
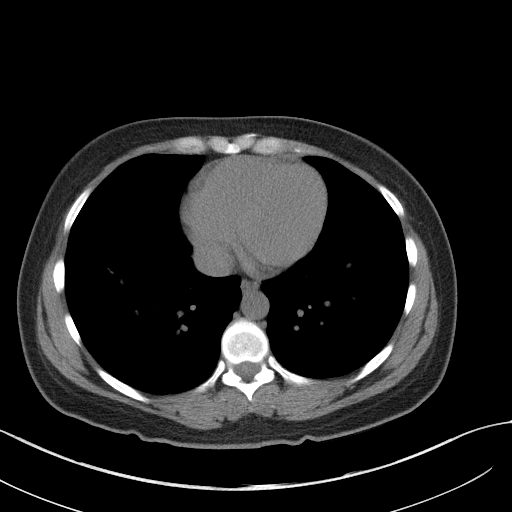

[Series 5: renal stone 3.0 coronal · coronal · 0.88mm/px · 3 of 77 slices shown]
[im 26/77  soft-tissue]
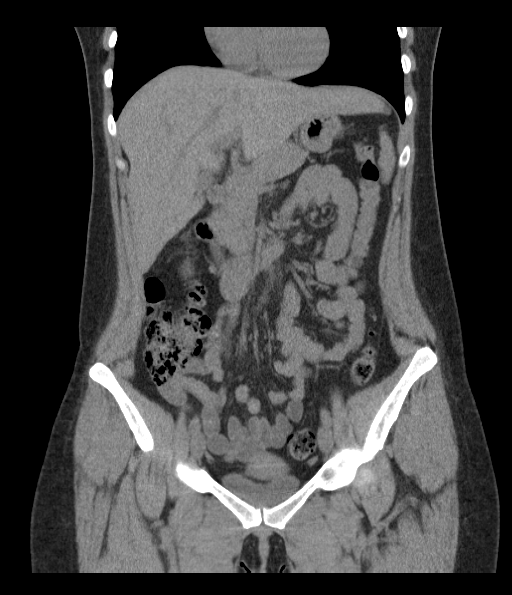
[im 34/77  soft-tissue]
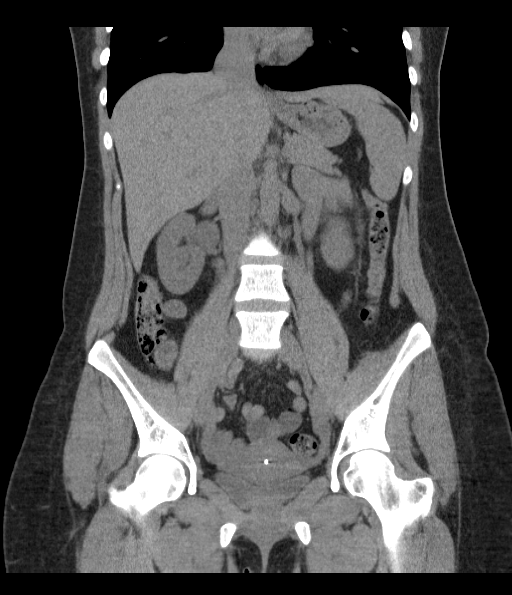
[im 43/77  soft-tissue]
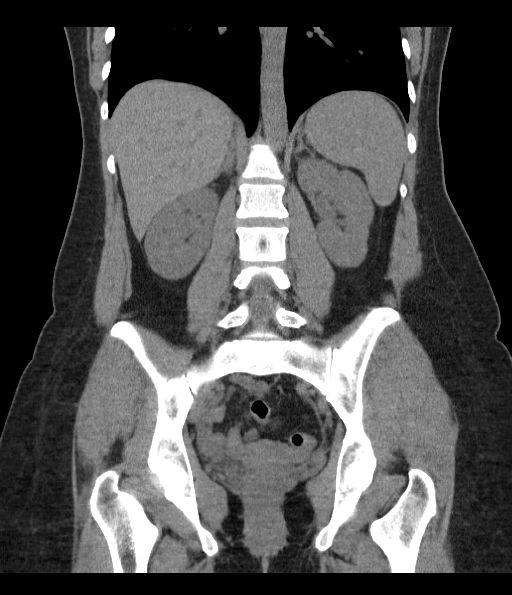

[17 of 46 positions shown; findings below may reference images not displayed]

FINDINGS: Lower chest:  Lung bases are clear.  Normal heart size.

Hepatobiliary: Normal liver. Normal gallbladder. No intrahepatic or
extrahepatic biliary ductal dilatation.

Pancreas: Normal.

Spleen: Normal.

Adrenals/Urinary Tract: Normal adrenal glands. Normal left kidney.
Moderate right hydroureteronephrosis with a distal 5 mm ureteral
calculus just proximal to the UVJ. Normal bladder.

Stomach/Bowel: No bowel dilatation. No bowel wall thickening. No
pneumatosis, pneumoperitoneum or portal venous gas. No abdominal or
pelvic free fluid. Fat containing umbilical hernia.

Vascular/Lymphatic: Normal caliber aorta. No abdominal or pelvic
lymphadenopathy.

Reproductive: Normal uterus. No adnexal mass. T-type IUD within the
uterus.

Other: No fluid collection or hematoma.

Musculoskeletal: No lytic or sclerotic osseous lesion. No acute
osseous abnormality of the lumbar spine or pelvis.
IMPRESSION: 1. 5 mm distal right ureteral calculus just proximal to the UVJ
resulting in moderate right hydroureteronephrosis.

## 2017-03-10 DIAGNOSIS — H18832 Recurrent erosion of cornea, left eye: Secondary | ICD-10-CM | POA: Diagnosis not present

## 2017-03-10 DIAGNOSIS — H2702 Aphakia, left eye: Secondary | ICD-10-CM | POA: Diagnosis not present

## 2017-03-10 DIAGNOSIS — H53002 Unspecified amblyopia, left eye: Secondary | ICD-10-CM | POA: Diagnosis not present

## 2017-04-27 DIAGNOSIS — Z0000 Encounter for general adult medical examination without abnormal findings: Secondary | ICD-10-CM | POA: Diagnosis not present

## 2017-06-22 LAB — OB RESULTS CONSOLE ABO/RH: RH TYPE: NEGATIVE

## 2017-06-22 LAB — OB RESULTS CONSOLE ANTIBODY SCREEN: ANTIBODY SCREEN: NEGATIVE

## 2017-06-22 LAB — OB RESULTS CONSOLE HEPATITIS B SURFACE ANTIGEN: HEP B S AG: NEGATIVE

## 2017-06-22 LAB — OB RESULTS CONSOLE RPR: RPR: NONREACTIVE

## 2017-06-22 LAB — OB RESULTS CONSOLE HIV ANTIBODY (ROUTINE TESTING): HIV: NONREACTIVE

## 2017-06-22 LAB — OB RESULTS CONSOLE RUBELLA ANTIBODY, IGM: RUBELLA: NON-IMMUNE/NOT IMMUNE

## 2017-10-08 ENCOUNTER — Other Ambulatory Visit (HOSPITAL_COMMUNITY): Payer: Self-pay | Admitting: Obstetrics and Gynecology

## 2017-10-08 DIAGNOSIS — Z3A25 25 weeks gestation of pregnancy: Secondary | ICD-10-CM

## 2017-10-08 DIAGNOSIS — Z3689 Encounter for other specified antenatal screening: Secondary | ICD-10-CM

## 2017-10-08 DIAGNOSIS — Q893 Situs inversus: Secondary | ICD-10-CM

## 2017-10-09 ENCOUNTER — Other Ambulatory Visit (HOSPITAL_COMMUNITY): Payer: Self-pay | Admitting: Obstetrics and Gynecology

## 2017-10-09 DIAGNOSIS — Q893 Situs inversus: Secondary | ICD-10-CM

## 2017-10-09 DIAGNOSIS — Z3689 Encounter for other specified antenatal screening: Secondary | ICD-10-CM

## 2017-10-09 DIAGNOSIS — Z3A26 26 weeks gestation of pregnancy: Secondary | ICD-10-CM

## 2017-10-14 ENCOUNTER — Encounter (HOSPITAL_COMMUNITY): Payer: Self-pay | Admitting: Obstetrics and Gynecology

## 2017-10-15 ENCOUNTER — Other Ambulatory Visit (HOSPITAL_COMMUNITY): Payer: 59

## 2017-10-15 ENCOUNTER — Encounter (HOSPITAL_COMMUNITY): Payer: 59

## 2017-10-16 ENCOUNTER — Other Ambulatory Visit (HOSPITAL_COMMUNITY): Payer: 59

## 2017-10-16 ENCOUNTER — Encounter (HOSPITAL_COMMUNITY): Payer: 59

## 2017-10-20 ENCOUNTER — Encounter (HOSPITAL_COMMUNITY): Payer: Self-pay

## 2017-10-22 ENCOUNTER — Other Ambulatory Visit (HOSPITAL_COMMUNITY): Payer: Self-pay | Admitting: *Deleted

## 2017-10-22 ENCOUNTER — Ambulatory Visit (HOSPITAL_COMMUNITY)
Admission: RE | Admit: 2017-10-22 | Discharge: 2017-10-22 | Disposition: A | Discharge status: Home / Self Care | Payer: 59 | Source: Ambulatory Visit | Attending: Obstetrics and Gynecology | Admitting: Obstetrics and Gynecology

## 2017-10-22 ENCOUNTER — Encounter (HOSPITAL_COMMUNITY): Payer: Self-pay

## 2017-10-22 ENCOUNTER — Ambulatory Visit (HOSPITAL_COMMUNITY): Admission: RE | Admit: 2017-10-22 | Payer: 59 | Source: Ambulatory Visit

## 2017-10-22 DIAGNOSIS — Z3689 Encounter for other specified antenatal screening: Secondary | ICD-10-CM | POA: Diagnosis present

## 2017-10-22 DIAGNOSIS — Q893 Situs inversus: Secondary | ICD-10-CM | POA: Insufficient documentation

## 2017-10-22 DIAGNOSIS — O359XX0 Maternal care for (suspected) fetal abnormality and damage, unspecified, not applicable or unspecified: Secondary | ICD-10-CM | POA: Insufficient documentation

## 2017-10-22 DIAGNOSIS — Z3A26 26 weeks gestation of pregnancy: Secondary | ICD-10-CM | POA: Diagnosis not present

## 2017-10-22 DIAGNOSIS — O09522 Supervision of elderly multigravida, second trimester: Secondary | ICD-10-CM | POA: Diagnosis not present

## 2017-10-22 HISTORY — DX: Calculus of kidney: N20.0

## 2017-10-22 NOTE — Consult Note (Signed)
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 She has AMA but low-risk NIPT so no further testing or consult has been ordered. This pregnancy has been unremarkable so far. With the exception of a first trimester SAB, her previous pregnancies have been unremarkable as well, and both deliveries uncomplicated OB History: OB History    Gravida Para Term Preterm AB Living   4 2 2   1 2    SAB TAB Ectopic Multiple Live Births   1       1      PMH:  Past Medical History:  Diagnosis Date  . Bell's palsy   . Kidney stone   . Medical history non-contributory     PSH:  Past Surgical History:  Procedure Laterality Date  . ANTERIOR CRUCIATE LIGAMENT REPAIR    . CATARACT EXTRACTION    . DILATION AND CURETTAGE OF UTERUS    . NO PAST SURGERIES     Meds: PNV Allergies: NKDA FH: See EPIC section Soc: See EPIC section  Review of Systems: no vaginal bleeding or cramping/contractions, no LOF, no nausea/vomiting. All other systems reviewed and are negative.  PE:  VS: See EPIC section GEN: well-appearing female ABD: gravid, NT  Please see separate document for fetal ultrasound report.  A/P: Situs inversus with dextrocardia (totalis) Today's scan does not show any cardiac defect, but due to fetal position our ability to "clear" the heart anatomy is limited. To that end a fetal echocardiogram will be obtained ASAP to rule out atrial isomerism, I am unable to adequately visualize the spleen on today's scan so I have asked the patient to return in 4 weeks and we will check again then. Clearly, the presence or absence of atrial isomerism will determine the prognosis. If absent then no further evaluation needs to be done until after deliver, when the baby can be assessed for primary ciliary dyskinesia and  other syndromes not diagnosable prenatally.  Should cardiac abnormalities be seen, then a repeat MFM consult and genetic counseling is warranted.  Thank you for the opportunity to be a part of the care of [employer]anie K Flow. Please contact our office if we can be of further assistance.   I spent approximately 15 minutes with this patient with over 50% of time spent in face-to-face counseling.

## 2017-10-23 ENCOUNTER — Encounter (HOSPITAL_COMMUNITY): Payer: Self-pay

## 2017-11-19 ENCOUNTER — Ambulatory Visit (HOSPITAL_COMMUNITY): Payer: 59

## 2017-11-19 ENCOUNTER — Encounter (HOSPITAL_COMMUNITY): Payer: Self-pay

## 2017-12-22 NOTE — L&D Delivery Note (Signed)
Delivery Note At 4:36 PM a viable and healthy female was delivered via Vaginal, Spontaneous (Presentation:LOA ).  APGAR: 9, 9; weight  pending.   Placenta status: spontaneous, intact.  Cord:  with the following complications: none.  Cord pH: na  Anesthesia:  epidural Episiotomy: None Lacerations: 2nd degree;Perineal Suture Repair: 2.0 vicryl rapide Est. Blood Loss (mL):    Mom to postpartum.  Baby to Couplet care / Skin to Skin.  Christia Domke J 01/18/2018, 4:54 PM

## 2017-12-23 LAB — OB RESULTS CONSOLE GBS: GBS: NEGATIVE

## 2018-01-08 ENCOUNTER — Encounter (HOSPITAL_COMMUNITY): Payer: Self-pay | Admitting: *Deleted

## 2018-01-08 ENCOUNTER — Telehealth (HOSPITAL_COMMUNITY): Payer: Self-pay | Admitting: *Deleted

## 2018-01-08 ENCOUNTER — Other Ambulatory Visit: Payer: Self-pay | Admitting: Obstetrics and Gynecology

## 2018-01-08 NOTE — Telephone Encounter (Signed)
Preadmission screen  

## 2018-01-18 ENCOUNTER — Encounter (HOSPITAL_COMMUNITY): Payer: Self-pay

## 2018-01-18 ENCOUNTER — Inpatient Hospital Stay (HOSPITAL_COMMUNITY): Payer: 59 | Admitting: Anesthesiology

## 2018-01-18 ENCOUNTER — Inpatient Hospital Stay (HOSPITAL_COMMUNITY)
Admission: RE | Admit: 2018-01-18 | Discharge: 2018-01-20 | DRG: 807 | Disposition: A | Discharge status: Home / Self Care | Payer: 59 | Source: Ambulatory Visit | Attending: Obstetrics and Gynecology | Admitting: Obstetrics and Gynecology

## 2018-01-18 DIAGNOSIS — O26893 Other specified pregnancy related conditions, third trimester: Secondary | ICD-10-CM | POA: Diagnosis present

## 2018-01-18 DIAGNOSIS — D696 Thrombocytopenia, unspecified: Secondary | ICD-10-CM | POA: Diagnosis not present

## 2018-01-18 DIAGNOSIS — O358XX0 Maternal care for other (suspected) fetal abnormality and damage, not applicable or unspecified: Principal | ICD-10-CM | POA: Diagnosis present

## 2018-01-18 DIAGNOSIS — Z6791 Unspecified blood type, Rh negative: Secondary | ICD-10-CM | POA: Diagnosis not present

## 2018-01-18 DIAGNOSIS — Z3A39 39 weeks gestation of pregnancy: Secondary | ICD-10-CM

## 2018-01-18 DIAGNOSIS — Z3490 Encounter for supervision of normal pregnancy, unspecified, unspecified trimester: Secondary | ICD-10-CM | POA: Diagnosis present

## 2018-01-18 DIAGNOSIS — Q893 Situs inversus: Secondary | ICD-10-CM

## 2018-01-18 LAB — RPR: RPR: NONREACTIVE

## 2018-01-18 LAB — CBC
HCT: 35.1 % — ABNORMAL LOW (ref 36.0–46.0)
HEMOGLOBIN: 12.1 g/dL (ref 12.0–15.0)
MCH: 30.6 pg (ref 26.0–34.0)
MCHC: 34.5 g/dL (ref 30.0–36.0)
MCV: 88.9 fL (ref 78.0–100.0)
Platelets: 153 10*3/uL (ref 150–400)
RBC: 3.95 MIL/uL (ref 3.87–5.11)
RDW: 13.7 % (ref 11.5–15.5)
WBC: 7.5 10*3/uL (ref 4.0–10.5)

## 2018-01-18 MED ORDER — DIPHENHYDRAMINE HCL 25 MG PO CAPS: 25.0000 mg | ORAL_CAPSULE | Freq: Four times a day (QID) | ORAL | Status: DC | PRN

## 2018-01-18 MED ORDER — DIBUCAINE 1 % RE OINT: 1.0000 "application " | TOPICAL_OINTMENT | RECTAL | Status: DC | PRN

## 2018-01-18 MED ORDER — IBUPROFEN 600 MG PO TABS
600.0000 mg | ORAL_TABLET | Freq: Four times a day (QID) | ORAL | Status: DC
  Administered 2018-01-18 – 2018-01-20 (×7): 600 mg via ORAL
  Filled 2018-01-18 (×7): qty 1

## 2018-01-18 MED ORDER — PRENATAL MULTIVITAMIN CH
1.0000 | ORAL_TABLET | Freq: Every day | ORAL | Status: DC
  Administered 2018-01-19: 1 via ORAL
  Filled 2018-01-18: qty 1

## 2018-01-18 MED ORDER — WITCH HAZEL-GLYCERIN EX PADS: 1.0000 "application " | MEDICATED_PAD | CUTANEOUS | Status: DC | PRN

## 2018-01-18 MED ORDER — PHENYLEPHRINE 40 MCG/ML (10ML) SYRINGE FOR IV PUSH (FOR BLOOD PRESSURE SUPPORT)
80.0000 ug | PREFILLED_SYRINGE | INTRAVENOUS | Status: DC | PRN
  Filled 2018-01-18: qty 5
  Filled 2018-01-18: qty 10

## 2018-01-18 MED ORDER — FENTANYL 2.5 MCG/ML BUPIVACAINE 1/10 % EPIDURAL INFUSION (WH - ANES)
14.0000 mL/h | INTRAMUSCULAR | Status: DC | PRN
  Administered 2018-01-18: 14 mL/h via EPIDURAL
  Filled 2018-01-18: qty 100

## 2018-01-18 MED ORDER — METHYLERGONOVINE MALEATE 0.2 MG PO TABS: 0.2000 mg | ORAL_TABLET | ORAL | Status: DC | PRN

## 2018-01-18 MED ORDER — SOD CITRATE-CITRIC ACID 500-334 MG/5ML PO SOLN: 30.0000 mL | ORAL | Status: DC | PRN

## 2018-01-18 MED ORDER — OXYCODONE-ACETAMINOPHEN 5-325 MG PO TABS: 1.0000 | ORAL_TABLET | ORAL | Status: DC | PRN

## 2018-01-18 MED ORDER — ONDANSETRON HCL 4 MG PO TABS: 4.0000 mg | ORAL_TABLET | ORAL | Status: DC | PRN

## 2018-01-18 MED ORDER — OXYTOCIN BOLUS FROM INFUSION
500.0000 mL | Freq: Once | INTRAVENOUS | Status: AC
  Administered 2018-01-18: 500 mL via INTRAVENOUS

## 2018-01-18 MED ORDER — EPHEDRINE 5 MG/ML INJ
10.0000 mg | INTRAVENOUS | Status: DC | PRN
  Filled 2018-01-18: qty 2

## 2018-01-18 MED ORDER — BENZOCAINE-MENTHOL 20-0.5 % EX AERO
1.0000 "application " | INHALATION_SPRAY | CUTANEOUS | Status: DC | PRN
  Filled 2018-01-18: qty 56

## 2018-01-18 MED ORDER — ACETAMINOPHEN 325 MG PO TABS: 650.0000 mg | ORAL_TABLET | ORAL | Status: DC | PRN

## 2018-01-18 MED ORDER — OXYCODONE-ACETAMINOPHEN 5-325 MG PO TABS: 2.0000 | ORAL_TABLET | ORAL | Status: DC | PRN

## 2018-01-18 MED ORDER — LACTATED RINGERS IV SOLN: 500.0000 mL | INTRAVENOUS | Status: DC | PRN

## 2018-01-18 MED ORDER — LIDOCAINE HCL (PF) 1 % IJ SOLN
INTRAMUSCULAR | Status: DC | PRN
  Administered 2018-01-18: 6 mL via EPIDURAL
  Administered 2018-01-18: 4 mL via EPIDURAL

## 2018-01-18 MED ORDER — SENNOSIDES-DOCUSATE SODIUM 8.6-50 MG PO TABS
2.0000 | ORAL_TABLET | ORAL | Status: DC
  Administered 2018-01-18 – 2018-01-19 (×2): 2 via ORAL
  Filled 2018-01-18 (×2): qty 2

## 2018-01-18 MED ORDER — COCONUT OIL OIL
1.0000 "application " | TOPICAL_OIL | Status: DC | PRN
  Administered 2018-01-20: 1 via TOPICAL
  Filled 2018-01-18: qty 120

## 2018-01-18 MED ORDER — LIDOCAINE HCL (PF) 1 % IJ SOLN
INTRAMUSCULAR | Status: AC
  Filled 2018-01-18: qty 30

## 2018-01-18 MED ORDER — SIMETHICONE 80 MG PO CHEW: 80.0000 mg | CHEWABLE_TABLET | ORAL | Status: DC | PRN

## 2018-01-18 MED ORDER — METHYLERGONOVINE MALEATE 0.2 MG/ML IJ SOLN: 0.2000 mg | INTRAMUSCULAR | Status: DC | PRN

## 2018-01-18 MED ORDER — OXYTOCIN 40 UNITS IN LACTATED RINGERS INFUSION - SIMPLE MED
1.0000 m[IU]/min | INTRAVENOUS | Status: DC
  Administered 2018-01-18: 8 m[IU]/min via INTRAVENOUS
  Administered 2018-01-18: 2 m[IU]/min via INTRAVENOUS
  Administered 2018-01-18: 12 m[IU]/min via INTRAVENOUS
  Administered 2018-01-18: 4 m[IU]/min via INTRAVENOUS
  Administered 2018-01-18: 14 m[IU]/min via INTRAVENOUS
  Filled 2018-01-18: qty 1000

## 2018-01-18 MED ORDER — ONDANSETRON HCL 4 MG/2ML IJ SOLN: 4.0000 mg | Freq: Four times a day (QID) | INTRAMUSCULAR | Status: DC | PRN

## 2018-01-18 MED ORDER — LACTATED RINGERS IV SOLN
500.0000 mL | Freq: Once | INTRAVENOUS | Status: AC
  Administered 2018-01-18: 500 mL via INTRAVENOUS

## 2018-01-18 MED ORDER — LACTATED RINGERS IV SOLN
INTRAVENOUS | Status: DC
  Administered 2018-01-18 (×2): via INTRAVENOUS

## 2018-01-18 MED ORDER — DIPHENHYDRAMINE HCL 50 MG/ML IJ SOLN: 12.5000 mg | INTRAMUSCULAR | Status: DC | PRN

## 2018-01-18 MED ORDER — TERBUTALINE SULFATE 1 MG/ML IJ SOLN
0.2500 mg | Freq: Once | INTRAMUSCULAR | Status: DC | PRN
  Filled 2018-01-18: qty 1

## 2018-01-18 MED ORDER — ONDANSETRON HCL 4 MG/2ML IJ SOLN: 4.0000 mg | INTRAMUSCULAR | Status: DC | PRN

## 2018-01-18 MED ORDER — OXYTOCIN 40 UNITS IN LACTATED RINGERS INFUSION - SIMPLE MED: 2.5000 [IU]/h | INTRAVENOUS | Status: DC

## 2018-01-18 MED ORDER — TETANUS-DIPHTH-ACELL PERTUSSIS 5-2.5-18.5 LF-MCG/0.5 IM SUSP: 0.5000 mL | Freq: Once | INTRAMUSCULAR | Status: DC

## 2018-01-18 MED ORDER — LIDOCAINE HCL (PF) 1 % IJ SOLN
30.0000 mL | INTRAMUSCULAR | Status: DC | PRN
  Filled 2018-01-18: qty 30

## 2018-01-18 MED ORDER — ZOLPIDEM TARTRATE 5 MG PO TABS: 5.0000 mg | ORAL_TABLET | Freq: Every evening | ORAL | Status: DC | PRN

## 2018-01-18 MED ORDER — PHENYLEPHRINE 40 MCG/ML (10ML) SYRINGE FOR IV PUSH (FOR BLOOD PRESSURE SUPPORT)
80.0000 ug | PREFILLED_SYRINGE | INTRAVENOUS | Status: DC | PRN
  Filled 2018-01-18: qty 5

## 2018-01-18 NOTE — Anesthesia Preprocedure Evaluation (Signed)
Anesthesia Evaluation  Patient identified by MRN, date of birth, ID band Patient awake    Reviewed: Allergy & Precautions, H&P , NPO status , Patient's Chart, lab work & pertinent test results  Airway Mallampati: I  TM Distance: >3 FB Neck ROM: full    Dental no notable dental hx. (+) Teeth Intact   Pulmonary neg pulmonary ROS,    Pulmonary exam normal breath sounds clear to auscultation       Cardiovascular negative cardio ROS Normal cardiovascular exam Rhythm:regular Rate:Normal     Neuro/Psych negative psych ROS   GI/Hepatic negative GI ROS, Neg liver ROS,   Endo/Other  negative endocrine ROS  Renal/GU   negative genitourinary   Musculoskeletal negative musculoskeletal ROS (+)   Abdominal (+) + obese,   Peds  Hematology negative hematology ROS (+)   Anesthesia Other Findings       Reproductive/Obstetrics (+) Pregnancy                             Anesthesia Physical  Anesthesia Plan  ASA: II  Anesthesia Plan: Epidural   Post-op Pain Management:    Induction:   PONV Risk Score and Plan:   Airway Management Planned:   Additional Equipment:   Intra-op Plan:   Post-operative Plan:   Informed Consent: I have reviewed the patients History and Physical, chart, labs and discussed the procedure including the risks, benefits and alternatives for the proposed anesthesia with the patient or authorized representative who has indicated his/her understanding and acceptance.   Dental Advisory Given  Plan Discussed with:   Anesthesia Plan Comments: (Labs checked- platelets confirmed with RN in room. Fetal heart tracing, per RN, reported to be stable enough for sitting procedure. Discussed epidural, and patient consents to the procedure:  included risk of possible headache,backache, failed block, allergic reaction, and nerve injury. This patient was asked if she had any questions or  concerns before the procedure started. )        Anesthesia Quick Evaluation

## 2018-01-18 NOTE — H&P (Signed)
Leslie Schultz is a 37 y.o. female presenting for IOL at 39w with known fetal anomaly. OB History    Gravida Para Term Preterm AB Living   4 2 2   1 2    SAB TAB Ectopic Multiple Live Births   1       2     Past Medical History:  Diagnosis Date  . Bell's palsy   . Kidney stone   . Medical history non-contributory    Past Surgical History:  Procedure Laterality Date  . ANTERIOR CRUCIATE LIGAMENT REPAIR    . CATARACT EXTRACTION    . DILATION AND CURETTAGE OF UTERUS    . EYE SURGERY     cataract  . NO PAST SURGERIES     Family History: family history includes Bladder Cancer in her paternal grandfather; Hyperlipidemia in her mother; Hypertension in her father; Lung cancer in her paternal grandmother; Migraines in her father; Pancreatic cancer in her maternal grandfather; Skin cancer in her paternal grandfather; Thyroid cancer in her paternal grandfather. Social History:  reports that  has never smoked. she has never used smokeless tobacco. She reports that she does not drink alcohol or use drugs.     Maternal Diabetes: No Genetic Screening: Normal Maternal Ultrasounds/Referrals: Abnormal:  Findings:   Other: Fetal Ultrasounds or other Referrals:  Fetal echo, Referred to Materal Fetal Medicine  Maternal Substance Abuse:  No Significant Maternal Medications:  None Significant Maternal Lab Results:  None Other Comments:  None  Review of Systems  All other systems reviewed and are negative.  Maternal Medical History:  Contractions: Perceived severity is mild.    Fetal activity: Perceived fetal activity is normal.   Last perceived fetal movement was within the past hour.    Prenatal complications: no prenatal complications Prenatal Complications - Diabetes: none.    Dilation: 2 Effacement (%): 50 Station: -2 Exam by:: Sieanna Vanstone Blood pressure (!) 123/98, pulse 94, temperature 98 F (36.7 C), temperature source Oral, resp. rate 20, height 5\' 7"  (1.702 m), weight 95.3 kg (210  lb), unknown if currently breastfeeding. Maternal Exam:  Uterine Assessment: Contraction strength is mild.  Contraction frequency is rare.   Abdomen: Fetal presentation: vertex  Introitus: Normal vulva. Normal vagina.  Ferning test: not done.  Nitrazine test: not done. Amniotic fluid character: meconium stained.  Pelvis: adequate for delivery.   Cervix: Cervix evaluated by digital exam.     Physical Exam  Nursing note and vitals reviewed. Constitutional: She is oriented to person, place, and time. She appears well-developed and well-nourished.  HENT:  Head: Normocephalic and atraumatic.  Neck: Normal range of motion. Neck supple.  Cardiovascular: Normal rate and regular rhythm.  GI: Soft. Bowel sounds are normal.  Genitourinary: Vagina normal and uterus normal.  Musculoskeletal: Normal range of motion.  Neurological: She is alert and oriented to person, place, and time. She has normal reflexes.  Skin: Skin is warm and dry.  Psychiatric: She has a normal mood and affect.    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 NO evidence of isomerism. Admit IOL Peds aware for fu plan   Lattie Cervi J 01/18/2018, 12:39 PM

## 2018-01-18 NOTE — Plan of Care (Signed)
  Education: Knowledge of condition will improve 01/18/2018 1815 by Karn Cassissborne, Sherrilyn Nairn H, RN Note Admission education, safety and unit protocols reviewed with patient and significant other.

## 2018-01-18 NOTE — Anesthesia Postprocedure Evaluation (Signed)
Anesthesia Post Note  Patient: Leslie RosenthalJoanie K Brasher  Procedure(s) Performed: AN AD HOC LABOR EPIDURAL     Patient location during evaluation: Mother Baby Anesthesia Type: Epidural Level of consciousness: awake and alert Pain management: pain level controlled Vital Signs Assessment: post-procedure vital signs reviewed and stable Respiratory status: spontaneous breathing, nonlabored ventilation and respiratory function stable Cardiovascular status: stable Postop Assessment: no headache, no backache and epidural receding Anesthetic complications: no    Last Vitals:  Vitals:   01/18/18 1805 01/18/18 1847  BP: 117/68 109/60  Pulse: 80 68  Resp: 18 17  Temp: 36.6 C 37 C  SpO2:      Last Pain:  Vitals:   01/18/18 1855  TempSrc:   PainSc: 0-No pain   Pain Goal:                 EchoStarMERRITT,Kherington Meraz

## 2018-01-18 NOTE — Anesthesia Postprocedure Evaluation (Signed)
Anesthesia Post Note  Patient: Leslie RosenthalJoanie K Gowell  Procedure(s) Performed: AN AD HOC LABOR EPIDURAL     Anesthesia Post Evaluation  Last Vitals:  Vitals:   01/18/18 1805 01/18/18 1847  BP: 117/68 109/60  Pulse: 80 68  Resp: 18 17  Temp: 36.6 C 37 C  SpO2:      Last Pain:  Vitals:   01/18/18 1855  TempSrc:   PainSc: 0-No pain   Pain Goal:                 EchoStarMERRITT,Trenten Watchman

## 2018-01-18 NOTE — Anesthesia Pain Management Evaluation Note (Signed)
  CRNA Pain Management Visit Note  Patient: Leslie RosenthalJoanie K Flori, 37 y.o., female  "Hello I am a [facility] the anesthesia [facility]. We have an anesthesia team available at all times to provide care throughout the hospital, including epidural management and anesthesia for C-section. I don't know your plan for the delivery whether it a natural birth, water birth, IV sedation, nitrous supplementation, doula or epidural, but we want to meet your pain goals."   1.Was your pain managed to your expectations on prior hospitalizations?   Yes   2.What is your expectation for pain management during this hospitalization?     Epidural  3.How can we help you reach that goal?   Record the patient's initial score and the patient's pain goal.   Pain: 0  Pain Goal: [address] [facility] wants you to be able to say your pain was always managed very well.  Laban EmperorMalinova,Londa Mackowski Hristova 01/18/2018

## 2018-01-18 NOTE — Anesthesia Procedure Notes (Addendum)
Epidural Patient location during procedure: OB Start time: 01/18/2018 2:12 PM End time: 01/18/2018 2:15 PM  Staffing Anesthesiologist: Leilani AbleHatchett, Deovion Batrez, MD Performed: anesthesiologist   Preanesthetic Checklist Completed: patient identified, site marked, surgical consent, pre-op evaluation, timeout performed, IV checked, risks and benefits discussed and monitors and equipment checked  Epidural Patient position: sitting Prep: site prepped and draped and DuraPrep Patient monitoring: continuous pulse ox and blood pressure Approach: midline Location: L3-L4 Injection technique: LOR air  Needle:  Needle type: Tuohy  Needle gauge: 17 G Needle length: 9 cm and 9 Needle insertion depth: 6 cm Catheter type: closed end flexible Catheter size: 19 Gauge Catheter at skin depth: 11 cm Test dose: negative  Assessment Events: blood not aspirated, injection not painful, no injection resistance, negative IV test and no paresthesia

## 2018-01-19 ENCOUNTER — Other Ambulatory Visit: Payer: Self-pay

## 2018-01-19 LAB — CBC
HCT: 35 % — ABNORMAL LOW (ref 36.0–46.0)
HEMOGLOBIN: 11.9 g/dL — AB (ref 12.0–15.0)
MCH: 30.5 pg (ref 26.0–34.0)
MCHC: 34 g/dL (ref 30.0–36.0)
MCV: 89.7 fL (ref 78.0–100.0)
PLATELETS: 126 10*3/uL — AB (ref 150–400)
RBC: 3.9 MIL/uL (ref 3.87–5.11)
RDW: 13.7 % (ref 11.5–15.5)
WBC: 10.4 10*3/uL (ref 4.0–10.5)

## 2018-01-19 NOTE — Progress Notes (Signed)
PPD #1, SVD, 2nd degree repair, baby boy "Hunter"  S:  Reports feeling well              Tolerating po/ No nausea or vomiting / Denies dizziness or SOB             Bleeding is moderate             Pain controlled withMotrin             Up ad lib / ambulatory / voiding QS  Newborn breast feeding - mom reports he is latching well.  Baby had an echo this morning / Circumcision - planning today   O:               VS: BP 110/73 (BP Location: Left Arm)   Pulse 74   Temp 98.1 F (36.7 C) (Oral)   Resp 20   Ht 5\' 7"  (1.702 m)   Wt 95.3 kg (210 lb)   SpO2 100%   Breastfeeding? Unknown   BMI 32.89 kg/m    LABS:              Recent Labs    01/18/18 0800 01/19/18 0512  WBC 7.5 10.4  HGB 12.1 11.9*  PLT 153 126*               Blood type: --/--/A NEG (01/28 0800)  Rubella: Nonimmune (07/02 0000)                     I&O: Intake/Output      01/28 0701 - 01/29 0700 01/29 0701 - 01/30 0700   Urine (mL/kg/hr) 350    Blood 300    Total Output 650    Net -650                       Physical Exam:             Alert and oriented X3  Lungs: Clear and unlabored  Heart: regular rate and rhythm / no murmurs  Abdomen: soft, non-tender, non-distended              Fundus: firm, non-tender, U-1  Perineum: well approximated 2nd degree laceration, mild edema, no significant erythema or evidence of hematoma   Lochia: appropriate, no clots   Extremities: no edema, no calf pain or tenderness    A: PPD # SVD  2nd degree repair   RH negative/ Baby RH Negative - no Rhogam indicated   Postpartum thrombocytopenia - stable  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

## 2018-01-19 NOTE — Lactation Note (Signed)
This note was copied from a baby's chart. Lactation Consultation Note  Patient Name: Leslie Schultz ZOXWR'UToday's Date: 01/19/2018 Reason for consult: Initial assessment   Initial assessment with Exp BF mom of 23 hour old infant. Infant with 10 BF for 10-40 minutes, 7 voids and 6 stools since birth. Infant weight 8 lb 0.2 oz with 3% weight loss since birth. LATCH scores 8-10.   Mom reports some mild tenderness with initial latch, enc mom to apply EBM post BF. Mom reports she is able to hand express colostrum with each feeding.   Reviewed BF basics, awakening techniques, milk coming to volume, NB feeding behavior and nutritional needs, and hand expressing. Enc mom to latch in the football or cross cradle hold and use good head and pillow support with feedings. Infant was circumcised this morning and has continued to eat well.   Enc mom to feed infant STS 8-12 x in 24 hours at first feeding cues. Enc mom to massage/compress breast with feeding. Enc mom to call out for feeding assistance as needed.   BF Resources handout and [facility] Brochure given. Mom informed of IP/OP Services, BF Support Groups and LC phone #. Mom has Avent and Free Me pumps at home for prn pumping.   Mom without questions/concerns at this time.     Maternal Data Formula Feeding for Exclusion: No Has patient been taught Hand Expression?: Yes Does the patient have breastfeeding experience prior to this delivery?: Yes  Feeding Feeding Type: Breast Fed Length of feed: 30 min  LATCH Score Latch: Grasps breast easily, tongue down, lips flanged, rhythmical sucking.  Audible Swallowing: A few with stimulation  Type of Nipple: Everted at rest and after stimulation  Comfort (Breast/Nipple): Soft / non-tender  Hold (Positioning): No assistance needed to correctly position infant at breast.  LATCH Score: 9  Interventions Interventions: Breast feeding basics reviewed;Support pillows;Position options;Skin to skin;Expressed  milk;Breast compression;Hand express;Breast massage  Lactation Tools Discussed/Used WIC Program: No   Consult Status Consult Status: Follow-up Date: 01/20/18 Follow-up type: In-patient    [name] 01/19/2018, 4:12 PM

## 2018-01-20 ENCOUNTER — Other Ambulatory Visit: Payer: Self-pay

## 2018-01-20 LAB — BIRTH TISSUE RECOVERY COLLECTION (PLACENTA DONATION)

## 2018-01-20 MED ORDER — MEASLES, MUMPS & RUBELLA VAC ~~LOC~~ INJ
0.5000 mL | INJECTION | Freq: Once | SUBCUTANEOUS | Status: AC
  Administered 2018-01-20: 0.5 mL via SUBCUTANEOUS
  Filled 2018-01-20: qty 0.5

## 2018-01-20 MED ORDER — IBUPROFEN 600 MG PO TABS: 600.0000 mg | ORAL_TABLET | Freq: Four times a day (QID) | ORAL | 0 refills | Status: AC

## 2018-01-20 NOTE — Progress Notes (Signed)
S:  Reports feeling well overall.  Some c/o of nipple soreness right when baby latches, but then it quickly subsides.  Breastfeeding going well.  Tolerating regular diet without N/V.  Up ad lib/ambulating.  Voiding without difficulty.  Passing flatus.  Has had two bowel movements.  Reports bleeding as light, without clots.  Is planning on IUD for contraception.  Feels that family support is adequate.  Desires discharge home.  O: Vital signs BP 107/83 (BP Location: Left Arm)   Pulse 79   Temp 98.2 F (36.8 C) (Oral)   Resp 18   Ht 5\' 7"  (1.702 m)   Wt 95.3 kg (210 lb)   SpO2 100%   Breastfeeding? Unknown   BMI 32.89 kg/m  Labs   Recent Labs    01/18/18 0800 01/19/18 0512  WBC 7.5 10.4  HGB 12.1 11.9*  PLT 153 126*    General:  A&O x 3, cooperative, NAD. Skin:  Warm, dry, appropriate for ethnicity HEENT:  Symmetrical, no lumps noted.  Sclera white. No lesions, discharge, or swelling noted. Heart:  RRR, no murmurs. Lungs:  Breathing even and unlabored,  CTA bilaterally. Abdomen:  Soft, non-tender, non-distended.  Bowel sounds present all quadrants.  Some silver striae noted. Musculoskeletal:  Full ROM of neck, arm, and legs. Peripheral: No varicosities noted.  Trace edema noted. Fundus: firm, non-tender, U-3 Lochia small, without clots. Perineum:  Well approximated .  A: PPD #2 Bonding well with infant. Doing well, stable  P:   Discharge home. Return to clinic for PP visit at 6 weeks Rx: Ibuprofen Teaching/education given on breast care for bottle/breastfeeding, perineal care, diet, exercise, resuming intercourse..  Discussed  S/s of infection and when to call office.  Ceasar MonsSarah Talma Aguillard, RN, SNM 01/20/2018 , 9:34 AM

## 2018-01-20 NOTE — Discharge Summary (Signed)
Obstetric Discharge Summary Reason for Admission: induction of labor for fetal anomaly Prenatal Procedures: none Intrapartum Procedures: spontaneous vaginal delivery, epidural Postpartum Procedures: none Complications-Operative and Postpartum: 2nd degree perineal laceration Hemoglobin  Date Value Ref Range Status  01/19/2018 11.9 (L) 12.0 - 15.0 g/dL Final   HCT  Date Value Ref Range Status  01/19/2018 35.0 (L) 36.0 - 46.0 % Final    Physical Exam:  General: alert, cooperative, appears stated age and no distress Lochia: appropriate Uterine Fundus: firm DVT Evaluation: No evidence of DVT seen on physical exam.  Discharge Diagnoses: Term Pregnancy-delivered  Discharge Information: Date: 01/20/2018 Activity: unrestricted Diet: routine Medications: Ibuprofen Condition: stable Instructions: refer to practice specific booklet Discharge to: home Follow-up Information    Leslie Schultz, Richard, MD. Schedule an appointment as soon as possible for a visit in 6 week(s).   Specialty:  Obstetrics and Gynecology Contact information: [address] [place] [address] [phone]           Newborn Data: Live born female  Birth Weight: 8 lb 4.8 oz (3765 g) APGAR: 9, 9  Newborn Delivery   Birth date/time:  01/18/2018 16:36:00 Delivery type:  Vaginal, Spontaneous     Home with mother.  Leslie Schultz 01/20/2018, 9:41 AM

## 2018-01-20 NOTE — Lactation Note (Signed)
This note was copied from a baby's chart. Lactation Consultation Note Baby 4538 hrs old. Experienced BF mom states BF going well. Mom BF her 1st child 18 months, then her 2nd child for 3 yrs for comfort.  Mom stated her milk is coming in. Discussed engorgement, filling, prevention. Reviewed baby I&O and STS. Mom has 2 DEBP at home. Reminded of LC OP recourses.  Patient Name: Leslie Mackie [name] [id number]'s Date: 01/20/2018 Reason for consult: Follow-up assessment   Maternal Data    Feeding Feeding Type: Breast Fed Length of feed: 15 min  LATCH Score                   Interventions    Lactation Tools Discussed/Used     Consult Status Consult Status: Complete Date: 01/20/18    Charyl DancerCARVER, Jontae Sonier G 01/20/2018, 6:48 AM

## 2018-01-22 LAB — TYPE AND SCREEN
ABO/RH(D): A NEG
Antibody Screen: POSITIVE
UNIT DIVISION: 0
Unit division: 0

## 2018-01-22 LAB — BPAM RBC
BLOOD PRODUCT EXPIRATION DATE: 201902252359
BLOOD PRODUCT EXPIRATION DATE: 201902252359
Unit Type and Rh: 9500
Unit Type and Rh: 9500

## 2018-01-22 NOTE — Addendum Note (Signed)
Addendum  created 01/22/18 1232 by Leilani AbleHatchett, Patryce Depriest, MD   Intraprocedure Staff edited

## 2018-01-25 ENCOUNTER — Inpatient Hospital Stay (HOSPITAL_COMMUNITY): Admission: AD | Admit: 2018-01-25 | Payer: 59 | Source: Ambulatory Visit | Admitting: Obstetrics and Gynecology

## 2018-01-27 NOTE — Addendum Note (Signed)
Addendum  created 01/27/18 1315 by Leilani AbleHatchett, Iisha Soyars, MD   Intraprocedure Blocks edited, Sign clinical note

## 2018-12-03 DIAGNOSIS — R52 Pain, unspecified: Secondary | ICD-10-CM | POA: Diagnosis not present

## 2021-11-07 ENCOUNTER — Other Ambulatory Visit: Payer: Self-pay | Admitting: Obstetrics and Gynecology

## 2021-11-07 DIAGNOSIS — Z1231 Encounter for screening mammogram for malignant neoplasm of breast: Secondary | ICD-10-CM

## 2021-11-12 ENCOUNTER — Other Ambulatory Visit: Payer: Self-pay

## 2021-11-12 ENCOUNTER — Ambulatory Visit
Admission: RE | Admit: 2021-11-12 | Discharge: 2021-11-12 | Disposition: A | Discharge status: Home / Self Care | Payer: 59 | Source: Ambulatory Visit

## 2021-11-12 DIAGNOSIS — Z1231 Encounter for screening mammogram for malignant neoplasm of breast: Secondary | ICD-10-CM

## 2022-09-29 ENCOUNTER — Other Ambulatory Visit: Payer: Self-pay | Admitting: Obstetrics and Gynecology

## 2022-09-29 DIAGNOSIS — Z1231 Encounter for screening mammogram for malignant neoplasm of breast: Secondary | ICD-10-CM

## 2022-11-17 ENCOUNTER — Ambulatory Visit
Admission: RE | Admit: 2022-11-17 | Discharge: 2022-11-17 | Disposition: A | Discharge status: Home / Self Care | Payer: 59 | Source: Ambulatory Visit

## 2022-11-17 DIAGNOSIS — Z1231 Encounter for screening mammogram for malignant neoplasm of breast: Secondary | ICD-10-CM

## 2023-04-07 ENCOUNTER — Telehealth: Payer: 59 | Admitting: Physician Assistant

## 2023-04-07 DIAGNOSIS — A084 Viral intestinal infection, unspecified: Secondary | ICD-10-CM | POA: Diagnosis not present

## 2023-04-07 MED ORDER — ONDANSETRON 4 MG PO TBDP: 4.0000 mg | ORAL_TABLET | Freq: Three times a day (TID) | ORAL | 0 refills | Status: AC | PRN

## 2023-04-07 NOTE — Patient Instructions (Signed)
Leslie Schultz, thank you for joining Leslie Loveless, PA-C for today's virtual visit.  While this provider is not your primary care provider (PCP), if your PCP is located in our provider database this encounter information will be shared with them immediately following your visit.   A Leslie Schultz MyChart account gives you access to today's visit and all your visits, tests, and labs performed at [facility] " click here if you don't have a [facility] MyChart account or go to [email or web address]  Consent: (Patient) Leslie Schultz provided verbal consent for this virtual visit at the beginning of the encounter.  Current Medications:  Current Outpatient Medications:    ondansetron (ZOFRAN-ODT) 4 MG disintegrating tablet, Take 1-2 tablets (4-8 mg total) by mouth every 8 (eight) hours as needed., Disp: 20 tablet, Rfl: 0   ibuprofen (ADVIL,MOTRIN) 600 MG tablet, Take 1 tablet (600 mg total) by mouth every 6 (six) hours., Disp: 30 tablet, Rfl: 0   Magnesium Hydroxide (MAGNESIA PO), Take 1 tablet by mouth daily as needed (for leg cramps)., Disp: , Rfl:    Prenatal Vit-Fe Fumarate-FA (PRENATAL MULTIVITAMIN) TABS tablet, Take 1 tablet by mouth daily at 12 noon., Disp: , Rfl:    Medications ordered in this encounter:  Meds ordered this encounter  Medications   ondansetron (ZOFRAN-ODT) 4 MG disintegrating tablet    Sig: Take 1-2 tablets (4-8 mg total) by mouth every 8 (eight) hours as needed.    Dispense:  20 tablet    Refill:  0    Order Specific Question:   Supervising Provider    Answer:   Merrilee Jansky [id number]     *If you need refills on other medications prior to your next appointment, please contact your pharmacy*  Follow-Up: Call back or seek an in-person evaluation if the symptoms worsen or if the condition fails to improve as anticipated.  [facility] [phone]  Other Instructions  Viral Gastroenteritis, Adult  Viral gastroenteritis is  also known as the stomach flu. This condition may affect your stomach, your small intestine, and your large intestine. It can cause sudden watery poop (diarrhea), fever, and vomiting. This condition is caused by certain germs (viruses). These germs can be passed from person to person very easily (are contagious). Having watery poop and vomiting can make you feel weak and cause you to not have enough water in your body (get dehydrated). This can make you tired and thirsty, make you have a dry mouth, and make it so you pee (urinate) less often. It is important to replace the fluids that you lose from having watery poop and vomiting. What are the causes? You can get sick by catching germs from other people. You can also get sick by: Eating food, drinking water, or touching a surface that has the germs on it (is contaminated). Sharing utensils or other personal items with a person who is sick. What increases the risk? Having a weak body defense system (immune system). Living with one or more children who are younger than 2 years. Living in a nursing home. Going on cruise ships. What are the signs or symptoms? Symptoms of this condition start suddenly. Symptoms may last for a few days or for as long as a week. Common symptoms include: Watery poop. Vomiting. Other symptoms include: Fever. Headache. Feeling tired (fatigue). Pain in the belly (abdomen). Chills. Feeling weak. Feeling like you may vomit (nauseous). Muscle aches. Not feeling hungry. How is this treated? This condition typically  goes away on its own. The focus of treatment is to replace the fluids that you lose. This condition may be treated with: An ORS (oral rehydration solution). This is a drink that helps you replace fluids and minerals your body lost. It is sold at pharmacies and stores. Medicines to help with your symptoms. Probiotic supplements to reduce symptoms of watery poop. Fluids given through an IV tube, if  needed. Older adults and people with other diseases or a weak body defense system are at higher risk for not having enough water in the body. Follow these instructions at home: Eating and drinking  Take an ORS as told by your doctor. Drink clear fluids in small amounts as you are able. Clear fluids include: Water. Ice chips. Fruit juice that has water added to it (is diluted). Low-calorie sports drinks. Drink enough fluid to keep your pee (urine) pale yellow. Eat small amounts of healthy foods every 3-4 hours as you are able. This may include whole grains, fruits, vegetables, lean meats, and yogurt. Avoid fluids that have a lot of sugar or caffeine in them. This includes energy drinks, sports drinks, and soda. Avoid spicy or fatty foods. Avoid alcohol. General instructions  Wash your hands often. This is very important after you have watery poop or you vomit. If you cannot use soap and water, use hand sanitizer. Make sure that all people in your home wash their hands well and often. Take over-the-counter and prescription medicines only as told by your doctor. Rest at home while you get better. Watch your condition for any changes. Take a warm bath to help with any burning or pain from having watery poop. Keep all follow-up visits. Contact a doctor if: You cannot keep fluids down. Your symptoms get worse. You have new symptoms. You feel light-headed or dizzy. You have muscle cramps. Get help right away if: You have chest pain. You have trouble breathing, or you are breathing very fast. You have a fast heartbeat. You feel very weak or you faint. You have a very bad headache, a stiff neck, or both. You have a rash. You have very bad pain, cramping, or bloating in your belly. Your skin feels cold and clammy. You feel mixed up (confused). You have pain when you pee. You have signs of not having enough water in the body, such as: Dark pee, hardly any pee, or no pee. Cracked  lips. Dry mouth. Sunken eyes. Feeling very sleepy. Feeling weak. You have signs of bleeding, such as: You see blood in your vomit. Your vomit looks like coffee grounds. You have bloody or black poop or poop that looks like tar. These symptoms may be an emergency. Get help right away. Call 911. Do not wait to see if the symptoms will go away. Do not drive yourself to the hospital. Summary Viral gastroenteritis is also known as the stomach flu. This condition can cause sudden watery poop (diarrhea), fever, and vomiting. These germs can be passed from person to person very easily. Take an ORS (oral rehydration solution) as told by your doctor. This is a drink that is sold at pharmacies and stores. Wash your hands often, especially after having watery poop or vomiting. If you cannot use soap and water, use hand sanitizer. This information is not intended to replace advice given to you by your health care provider. Make sure you discuss any questions you have with your health care provider. Document Revised: 10/07/2021 Document Reviewed: 10/07/2021 Elsevier Patient Education  2023 Elsevier  Inc.    If you have been instructed to have an in-person evaluation today at a local Urgent Care facility, please use the link below. It will take you to a list of all of our available [facility], including address, phone number and hours of operation. Please do not delay care.  [facility]  If you or a family member do not have a primary care provider, use the link below to schedule a visit and establish care. When you choose a [facility] primary care physician or advanced practice provider, you gain a long-term partner in health. Find a Primary Care Provider  Learn more about 's in-office and virtual care options:  - Get Care Now

## 2023-04-07 NOTE — Progress Notes (Signed)
Virtual Visit Consent   Leslie Schultz, you are scheduled for a virtual visit with a [facility] provider today. Just as with appointments in the office, your consent must be obtained to participate. Your consent will be active for this visit and any virtual visit you may have with one of our providers in the next 365 days. If you have a MyChart account, a copy of this consent can be sent to you electronically.  As this is a virtual visit, video technology does not allow for your provider to perform a traditional examination. This may limit your provider's ability to fully assess your condition. If your provider identifies any concerns that need to be evaluated in person or the need to arrange testing (such as labs, EKG, etc.), we will make arrangements to do so. Although advances in technology are sophisticated, we cannot ensure that it will always work on either your end or our end. If the connection with a video visit is poor, the visit may have to be switched to a telephone visit. With either a video or telephone visit, we are not always able to ensure that we have a secure connection.  By engaging in this virtual visit, you consent to the provision of healthcare and authorize for your insurance to be billed (if applicable) for the services provided during this visit. Depending on your insurance coverage, you may receive a charge related to this service.  I need to obtain your verbal consent now. Are you willing to proceed with your visit today? Leslie Schultz has provided verbal consent on 04/07/2023 for a virtual visit (video or telephone). [name], PA-C  Date: 04/07/2023 5:40 PM  Virtual Visit via Video Note   I, [name], connected with  Leslie Schultz  ([id number], May 09, 1981) on 04/07/23 at  5:45 PM EDT by a video-enabled telemedicine application and verified that I am speaking with the correct person using two identifiers.  Location: Patient: Virtual Visit Location  Patient: Home Provider: Virtual Visit Location Provider: Home Office   I discussed the limitations of evaluation and management by telemedicine and the availability of in person appointments. The patient expressed understanding and agreed to proceed.    History of Present Illness: Leslie Schultz is a 42 y.o. who identifies as a female who was assigned female at birth, and is being seen today for nausea and vomiting.  HPI: Emesis  This is a new problem. The current episode started today (4am). The problem occurs more than 10 times per day. The problem has been gradually worsening. The emesis has an appearance of stomach contents. There has been no fever. Associated symptoms include chills, diarrhea, headaches, myalgias and sweats. Pertinent negatives include no dizziness or fever. She has tried increased fluids for the symptoms. The treatment provided no relief.      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    Allergies: No Known Allergies Medications:  Current Outpatient Medications:    ondansetron (ZOFRAN-ODT) 4 MG disintegrating tablet, Take 1-2 tablets (4-8 mg total) by mouth every 8 (eight) hours as needed., Disp: 20 tablet, Rfl: 0   ibuprofen (ADVIL,MOTRIN) 600 MG tablet, Take 1 tablet (600 mg  total) by mouth every 6 (six) hours., Disp: 30 tablet, Rfl: 0   Magnesium Hydroxide (MAGNESIA PO), Take 1 tablet by mouth daily as needed (for leg cramps)., Disp: , Rfl:    Prenatal Vit-Fe Fumarate-FA (PRENATAL MULTIVITAMIN) TABS tablet, Take 1 tablet by mouth daily at 12 noon., Disp: , Rfl:   Observations/Objective: Patient is well-developed, well-nourished  in no acute distress.  Resting comfortably at home.  Head is normocephalic, atraumatic.  No labored breathing.  Speech is clear and coherent with logical content.  Patient is alert and oriented at baseline.    Assessment and Plan: 1. Viral gastroenteritis - ondansetron (ZOFRAN-ODT) 4 MG disintegrating tablet; Take 1-2 tablets (4-8 mg total) by mouth every 8 (eight) hours as needed.  Dispense: 20 tablet; Refill: 0  - Suspect viral gastroenteritis - Zofran for nausea - Push fluids, electrolyte beverages - Liquid diet, then increase to soft/bland (BRAT) diet over next day, then increase diet as tolerated - Seek in person evaluation if not improving or symptoms worsen   Follow Up Instructions: I discussed the assessment and treatment plan with the patient. The patient was provided an opportunity to ask questions and all were answered. The patient agreed with the plan and demonstrated an understanding of the instructions.  A copy of instructions were sent to the patient via MyChart unless otherwise noted below.    The patient was advised to call back or seek an in-person evaluation if the symptoms worsen or if the condition fails to improve as anticipated.  Time:  I spent 8 minutes with the patient via telehealth technology discussing the above problems/concerns.    [name], PA-C

## 2023-07-10 ENCOUNTER — Ambulatory Visit (INDEPENDENT_AMBULATORY_CARE_PROVIDER_SITE_OTHER): Payer: 59

## 2023-07-10 ENCOUNTER — Ambulatory Visit (INDEPENDENT_AMBULATORY_CARE_PROVIDER_SITE_OTHER): Payer: 59 | Admitting: Podiatry

## 2023-07-10 DIAGNOSIS — M778 Other enthesopathies, not elsewhere classified: Secondary | ICD-10-CM

## 2023-07-10 DIAGNOSIS — M722 Plantar fascial fibromatosis: Secondary | ICD-10-CM | POA: Diagnosis not present

## 2023-07-10 MED ORDER — MELOXICAM 15 MG PO TABS: 15.0000 mg | ORAL_TABLET | Freq: Every day | ORAL | 0 refills | Status: DC

## 2023-07-10 NOTE — Progress Notes (Signed)
  Subjective:  Patient ID: Leslie Schultz, female    DOB: Jul 18, 1981,  MRN: [id number]  Chief Complaint  Patient presents with   Foot Pain    Right foot pain to the heel and radiates to arch and achilles tendon once the pain is aggravated. Pain has been present since Jan 2024.     42 y.o. female presents with the above complaint. Pt with right plantar heel pain. Worse with first steps in AM. Been going on for a while   Review of Systems: Negative except as noted in the HPI. Denies N/V/F/Ch.   Objective:  There were no vitals filed for this visit. There is no height or weight on file to calculate BMI. Constitutional Well developed. Well nourished.  Vascular Dorsalis pedis pulses palpable bilaterally. Posterior tibial pulses palpable bilaterally. Capillary refill normal to all digits.  No cyanosis or clubbing noted. Pedal hair growth normal.  Neurologic Normal speech. Oriented to person, place, and time. Epicritic sensation to light touch grossly present bilaterally.  Dermatologic Nails well groomed and normal in appearance. No open wounds. No skin lesions.  Orthopedic: Normal joint ROM without pain or crepitus bilaterally. No visible deformities. Tender to palpation at the calcaneal tuber right. No pain with calcaneal squeeze right. Ankle ROM diminished range of motion right. Silfverskiold Test: negative right.   Radiographs: Taken and reviewed. No acute fractures or dislocations. No evidence of stress fracture.  Plantar heel spur present. Posterior heel spur absent.   Assessment:   1. Plantar fasciitis, right   2. Capsulitis of foot, right    Plan:  Patient was evaluated and treated and all questions answered.  Plantar Fasciitis, right - XR reviewed as above.  - Educated on icing and stretching. Instructions given.  - Injection delivered to the plantar fascia as below. - DME: Powersteps dispensed - Pharmacologic management: Meloxicam 15 mg daily. Educated on  risks/benefits and proper taking of medication.  Procedure: Injection Tendon/Ligament Location: Right plantar fascia at the glabrous junction; medial approach. Skin Prep: alcohol Injectate: 1 cc 0.5% marcaine plain, 1 cc kenalog 10. Disposition: Patient tolerated procedure well. Injection site dressed with a band-aid.  Return in about 4 weeks (around 08/07/2023) for Right PF.

## 2023-07-10 NOTE — Patient Instructions (Signed)

## 2023-08-06 ENCOUNTER — Ambulatory Visit (INDEPENDENT_AMBULATORY_CARE_PROVIDER_SITE_OTHER): Payer: 59 | Admitting: Podiatry

## 2023-08-06 DIAGNOSIS — B351 Tinea unguium: Secondary | ICD-10-CM | POA: Diagnosis not present

## 2023-08-06 DIAGNOSIS — M722 Plantar fascial fibromatosis: Secondary | ICD-10-CM

## 2023-08-06 MED ORDER — CICLOPIROX 8 % EX SOLN: Freq: Every day | CUTANEOUS | 0 refills | Status: DC

## 2023-08-06 NOTE — Progress Notes (Signed)
  Subjective:  Patient ID: Leslie Schultz, female    DOB: Aug 31, 1981,  MRN: [id number]  Chief Complaint  Patient presents with   Follow-up    Follow up plantar fasciitis of right foot. Patient stated she is doing a lot better. The injection was effective. She is taking the meloxicam and using powersteps when she has close toe shoes. She was on vacation and has been on her feet a lot and they are sore at times.    Nail Problem    Requesting to assess her toenails on 1st, 2nd and 3rd toe for nail fungus.     42 y.o. female presents with the above complaint. Pt following up on right PF.  Patient states she is much improved from prior not really having much of any pain at the current time the right heel.  Says it occasionally flares up but is managed with conservative therapies.  Also notes some white discoloration of the left lesser toenails.   Review of Systems: Negative except as noted in the HPI. Denies N/V/F/Ch.   Objective:  There were no vitals filed for this visit. There is no height or weight on file to calculate BMI. Constitutional Well developed. Well nourished.  Vascular Dorsalis pedis pulses palpable bilaterally. Posterior tibial pulses palpable bilaterally. Capillary refill normal to all digits.  No cyanosis or clubbing noted. Pedal hair growth normal.  Neurologic Normal speech. Oriented to person, place, and time. Epicritic sensation to light touch grossly present bilaterally.  Dermatologic Nails with white discoloration and ridging present on the left foot No open wounds. No skin lesions.  Orthopedic: Normal joint ROM without pain or crepitus bilaterally. No visible deformities. Nontender to palpation at the calcaneal tuber right. No pain with calcaneal squeeze right. Ankle ROM diminished range of motion right. Silfverskiold Test: negative right.   Radiographs: Taken and reviewed. No acute fractures or dislocations. No evidence of stress fracture.  Plantar heel spur  present. Posterior heel spur absent.   Assessment:   1. Plantar fasciitis, right   2. Onychomycosis     Plan:  Patient was evaluated and treated and all questions answered.  Plantar Fasciitis, right - - XR reviewed as above.  - Educated on icing and stretching. Instructions given.  - Injection deferred as much improved - DME: Powersteps continue - Pharmacologic management: Ibuprofen PRN  Onychomycosis -Very mild, appears improving after OTC therapy -Educated on etiology of nail fungus. - eRx for penlac 8% solution  No follow-ups on file.

## 2023-08-13 ENCOUNTER — Other Ambulatory Visit: Payer: Self-pay | Admitting: Medical Genetics

## 2023-08-13 DIAGNOSIS — Z006 Encounter for examination for normal comparison and control in clinical research program: Secondary | ICD-10-CM

## 2023-10-01 ENCOUNTER — Ambulatory Visit: Payer: 59 | Admitting: Podiatry

## 2023-10-01 DIAGNOSIS — M722 Plantar fascial fibromatosis: Secondary | ICD-10-CM

## 2023-10-01 MED ORDER — PREDNISONE 10 MG PO TABS: ORAL_TABLET | ORAL | 0 refills | Status: DC

## 2023-10-01 MED ORDER — TRIAMCINOLONE ACETONIDE 10 MG/ML IJ SUSP
10.0000 mg | Freq: Once | INTRAMUSCULAR | Status: AC
Start: 2023-10-01 — End: 2023-10-01
  Administered 2023-10-01: 10 mg via INTRA_ARTICULAR

## 2023-10-02 NOTE — Progress Notes (Signed)
Subjective:   Patient ID: Leslie Schultz, female   DOB: 42 y.o.   MRN: [id number]   HPI Patient presents with severe pain right heel that has been going on for a long time and patient had a previous injection which only gave temporary relief has had other treatments it has been going on for at least a year and is reaching a point where it is not allowing her to have any form of activity   ROS      Objective:  Physical Exam  Neurovascular status intact with exquisite discomfort medial fascial band right with fluid buildup around the insertional point that is quite intense and has been present for at least a year     Assessment:  Acute plantar fasciitis right not responding conservatively     Plan:  H&P reviewed condition sterile prep injected the plantar fascia 3 mg Kenalog 5 mg Xylocaine and due to the intensity of discomfort I applied air fracture walker I want her to wear full-time for 3 weeks to completely immobilize the heel and take all stress off them.  If symptoms do not improve  need to consider surgery in this patient and I reviewed that fact with her today

## 2023-10-27 ENCOUNTER — Other Ambulatory Visit: Payer: Self-pay | Admitting: Obstetrics and Gynecology

## 2023-10-27 DIAGNOSIS — Z1231 Encounter for screening mammogram for malignant neoplasm of breast: Secondary | ICD-10-CM

## 2023-10-30 ENCOUNTER — Encounter: Payer: Self-pay | Admitting: Podiatry

## 2023-10-30 ENCOUNTER — Ambulatory Visit (INDEPENDENT_AMBULATORY_CARE_PROVIDER_SITE_OTHER): Payer: 59 | Admitting: Podiatry

## 2023-10-30 DIAGNOSIS — M722 Plantar fascial fibromatosis: Secondary | ICD-10-CM | POA: Diagnosis not present

## 2023-11-01 NOTE — Progress Notes (Signed)
Subjective:   Patient ID: Leslie Schultz, female   DOB: 42 y.o.   MRN: [id number]   HPI Patient presents stating she is having quite a bit of improvement in her right heel in the boot has been very useful and she is only taken it off for some activity and it did well   ROS      Objective:  Physical Exam  Neurovascular status intact significant diminishment in pain of the plantar heel right with inflammation fluid of the medial band that is sore when pressed still upon deep palpation but quite a bit improved from previous visit     Assessment:  Acute plantar fasciitis right that at this point has not responded to immobilization     Plan:  H&P reviewed condition at great length.  At this point we are going to try to wean her off the boot and I spent a great deal of time going over types of shoe gear which would be best stretching exercises ice therapy and the process of reducing boot usage.  Patient will be seen back as symptoms indicate may require other treatments depending on response

## 2023-11-05 ENCOUNTER — Ambulatory Visit: Payer: 59 | Admitting: Podiatry

## 2023-11-09 ENCOUNTER — Other Ambulatory Visit (HOSPITAL_COMMUNITY): Payer: 59

## 2023-11-11 ENCOUNTER — Other Ambulatory Visit (HOSPITAL_COMMUNITY)
Admission: RE | Admit: 2023-11-11 | Discharge: 2023-11-11 | Disposition: A | Discharge status: Home / Self Care | Payer: 59 | Source: Ambulatory Visit | Attending: Medical Genetics | Admitting: Medical Genetics

## 2023-11-11 DIAGNOSIS — Z006 Encounter for examination for normal comparison and control in clinical research program: Secondary | ICD-10-CM | POA: Insufficient documentation

## 2023-11-24 ENCOUNTER — Ambulatory Visit
Admission: RE | Admit: 2023-11-24 | Discharge: 2023-11-24 | Disposition: A | Discharge status: Home / Self Care | Payer: 59 | Source: Ambulatory Visit

## 2023-11-24 DIAGNOSIS — Z1231 Encounter for screening mammogram for malignant neoplasm of breast: Secondary | ICD-10-CM

## 2023-11-24 LAB — GENECONNECT MOLECULAR SCREEN: Genetic Analysis Overall Interpretation: NEGATIVE

## 2024-01-04 ENCOUNTER — Encounter: Payer: Self-pay | Admitting: Podiatry

## 2024-01-04 ENCOUNTER — Ambulatory Visit (INDEPENDENT_AMBULATORY_CARE_PROVIDER_SITE_OTHER): Payer: 59

## 2024-01-04 ENCOUNTER — Ambulatory Visit (INDEPENDENT_AMBULATORY_CARE_PROVIDER_SITE_OTHER): Payer: 59 | Admitting: Podiatry

## 2024-01-04 DIAGNOSIS — M778 Other enthesopathies, not elsewhere classified: Secondary | ICD-10-CM | POA: Diagnosis not present

## 2024-01-06 NOTE — Progress Notes (Signed)
 Subjective:   Patient ID: Leslie Schultz, female   DOB: 43 y.o.   MRN: [id number]   HPI Patient presents stating the pain of her right heel has been severe and also into her left heel.  States it has been very bad when she gets up in the morning after periods of sitting   ROS      Objective:  Physical Exam  Neurovascular status intact with acute inflammation pain of right plantar fascia at the insertional point tendon calcaneus fluid buildup moderate on the left     Assessment:  Acute plantar fasciitis right with inflammation also plantar heel left failing to respond to numerous conservative treatments     Plan:  H&P reviewed and at this point given the long-term chronic nature of over 1 year history of failure to respond conservatively have recommended surgical intervention.  Patient wants this done and I allowed her to read a consent form going over surgery and recovery and alternative treatments complications.  Patient scheduled outpatient surgery after signing consent form air fracture walker dispensed all instructions on use that she will be in this for minimum of 4 weeks total recovery take approximately 6 months.  All questions answered at this time

## 2024-01-13 ENCOUNTER — Telehealth: Payer: Self-pay | Admitting: Podiatry

## 2024-01-13 NOTE — Telephone Encounter (Signed)
DOS-01/19/24  EPF [id number]  [facility] EFFECTIVE DATE-12/23/23  DEDUCTIBLE- $6000.00 WITH REMAINING $5676.61 OOP-$9000.00 WITH REMAINING $8676.61 COINSURANCE-20%  PER THE [employer] PORTAL, PRIOR AUTH HAS BEEN APPROVED FOR CPT CODE 27253. GOOD FROM 01/19/24 - 04/18/24  AUTH REFERENCE NUMBER: [id number]

## 2024-01-18 MED ORDER — HYDROCODONE-ACETAMINOPHEN 10-325 MG PO TABS: 1.0000 | ORAL_TABLET | Freq: Three times a day (TID) | ORAL | 0 refills | Status: AC | PRN

## 2024-01-18 NOTE — Addendum Note (Signed)
Addended by: Lenn Sink on: 01/18/2024 01:52 PM   Modules accepted: Orders

## 2024-01-19 DIAGNOSIS — M722 Plantar fascial fibromatosis: Secondary | ICD-10-CM | POA: Diagnosis not present

## 2024-01-25 ENCOUNTER — Encounter: Payer: Self-pay | Admitting: Podiatry

## 2024-01-25 ENCOUNTER — Ambulatory Visit (INDEPENDENT_AMBULATORY_CARE_PROVIDER_SITE_OTHER): Payer: 59

## 2024-01-25 ENCOUNTER — Ambulatory Visit (INDEPENDENT_AMBULATORY_CARE_PROVIDER_SITE_OTHER): Payer: 59 | Admitting: Podiatry

## 2024-01-25 DIAGNOSIS — Z9889 Other specified postprocedural states: Secondary | ICD-10-CM

## 2024-01-25 DIAGNOSIS — M722 Plantar fascial fibromatosis: Secondary | ICD-10-CM | POA: Diagnosis not present

## 2024-01-25 NOTE — Progress Notes (Signed)
Subjective:   Patient ID: Leslie Schultz, female   DOB: 43 y.o.   MRN: [id number]   HPI Patient states overall doing well would like to not have to wear the boot all the time heavy and becoming dirty and I need better stretch when I am at home   ROS      Objective:  Physical Exam  Neuro ocular status intact negative Denna Haggard' sign noted wound edges are coapted well patient has moderate edema in the midfoot and into the arch but overall pleased     Assessment:  Doing well post endoscopic release right with moderate swelling     Plan:  Precautionary x-ray taken reviewed I did today reapply sterile dressing I instructed on continued elevation compression immobilization and dispensed a surgical shoe and then also dispensed night splint to use at home keeping the foot at a consistent 90 degrees.  Patient to be seen back 2 weeks suture removal earlier if needed  X-rays indicate no signs of arch trauma with the arch height being normal from the preoperative x-ray

## 2024-02-08 ENCOUNTER — Encounter: Payer: Self-pay | Admitting: Podiatry

## 2024-02-08 ENCOUNTER — Ambulatory Visit (INDEPENDENT_AMBULATORY_CARE_PROVIDER_SITE_OTHER): Payer: 59 | Admitting: Podiatry

## 2024-02-08 DIAGNOSIS — M722 Plantar fascial fibromatosis: Secondary | ICD-10-CM | POA: Diagnosis not present

## 2024-02-08 NOTE — Progress Notes (Signed)
Subjective:   Patient ID: Leslie Schultz, female   DOB: 43 y.o.   MRN: [id number]   HPI Patient presents stating doing much better minimal discomfort   ROS      Objective:  Physical Exam  Neurovascular status intact negative Denna Haggard' sign noted wound edges well coapted stitches intact minimal plantar pain     Assessment:  Doing well post endoscopic release right     Plan:  Reviewed condition sterile dressing reapplied gave instructions on utilization of ankle compression stocking and continue boot usage as needed

## 2024-11-02 ENCOUNTER — Encounter: Payer: Self-pay | Admitting: Podiatry

## 2024-11-02 ENCOUNTER — Ambulatory Visit (INDEPENDENT_AMBULATORY_CARE_PROVIDER_SITE_OTHER): Admitting: Podiatry

## 2024-11-02 ENCOUNTER — Ambulatory Visit

## 2024-11-02 DIAGNOSIS — M722 Plantar fascial fibromatosis: Secondary | ICD-10-CM | POA: Diagnosis not present

## 2024-11-02 MED ORDER — TRIAMCINOLONE ACETONIDE 10 MG/ML IJ SUSP
10.0000 mg | Freq: Once | INTRAMUSCULAR | Status: AC
  Administered 2024-11-02: 10 mg via INTRA_ARTICULAR

## 2024-11-03 NOTE — Progress Notes (Signed)
 Subjective:   Patient ID: Celene K Rahal, female   DOB: 43 y.o.   MRN: [id number]   HPI Patient states by the end of the day she seems to have a lot of pain in both of her heels distal to the plantar fascia insertion.  The right one which was fixed does not have the same symptoms that it did preoperatively and they both have the same kind of condition   ROS      Objective:  Physical Exam  Plantar fascial inflammation bilateral with discomfort in the medial band near insertion     Assessment:  Acute plantar fasciitis does not appear to be related to traditional and I do see where we have had a release of the ligament plantar fascia right that is done well     Plan:  H&P reviewed condition sterile prep and injected the plantar fascia bilateral distal to the insertion 3 mg dexamethasone Kenalog  5 mg Xylocaine  applied sterile dressing bilateral and I am hoping that this will get this under control and can take Mobic  as needed.  Somewhat of a vexing condition and may look at neuropathic causes if symptoms persist  X-rays were evaluated I do not see that there has been further depression of the arch or other pathological process

## 2024-12-12 ENCOUNTER — Encounter (HOSPITAL_BASED_OUTPATIENT_CLINIC_OR_DEPARTMENT_OTHER): Payer: Self-pay | Admitting: Radiology

## 2024-12-12 ENCOUNTER — Other Ambulatory Visit (HOSPITAL_BASED_OUTPATIENT_CLINIC_OR_DEPARTMENT_OTHER): Payer: Self-pay | Admitting: Internal Medicine

## 2024-12-12 ENCOUNTER — Ambulatory Visit (HOSPITAL_BASED_OUTPATIENT_CLINIC_OR_DEPARTMENT_OTHER)
Admission: RE | Admit: 2024-12-12 | Discharge: 2024-12-12 | Disposition: A | Discharge status: Home / Self Care | Source: Ambulatory Visit | Attending: Internal Medicine | Admitting: Internal Medicine

## 2024-12-12 DIAGNOSIS — Z1231 Encounter for screening mammogram for malignant neoplasm of breast: Secondary | ICD-10-CM | POA: Insufficient documentation
